# Patient Record
Sex: Female | Born: 1970 | Race: Black or African American | Hispanic: No | Marital: Single | Smoking: Current every day smoker
Health system: Southern US, Community
[De-identification: ages and names within clinical notes are randomized; demographics above are authoritative.]

## PROBLEM LIST (undated history)

## (undated) DIAGNOSIS — K5792 Diverticulitis of intestine, part unspecified, without perforation or abscess without bleeding: Secondary | ICD-10-CM

## (undated) DIAGNOSIS — K5790 Diverticulosis of intestine, part unspecified, without perforation or abscess without bleeding: Secondary | ICD-10-CM

---

## 2007-05-15 ENCOUNTER — Emergency Department: Payer: Self-pay | Admitting: Emergency Medicine

## 2007-05-15 IMAGING — CR RIGHT RING FINGER 2+V
1 series · 3 of 3 positions shown · non-contrast
Comparison: none

REASON FOR EXAM: injury minor care 1
COMMENTS:   LMP: Three weeks ago

PROCEDURE:     DXR - DXR FINGER RING 4TH DIGIT RT PALOH  - [DATE] [DATE]
RESULT:     Images of the RIGHT fourth finger demonstrate a nondisplaced,
non angulated fracture involving the distal phalanx in the midportion. The
remaining bony portions of the fourth digit appear to be intact.

[Series 1: view not recorded · 0.17mm/px · 3 of 3 slices shown]
[im 1/3]
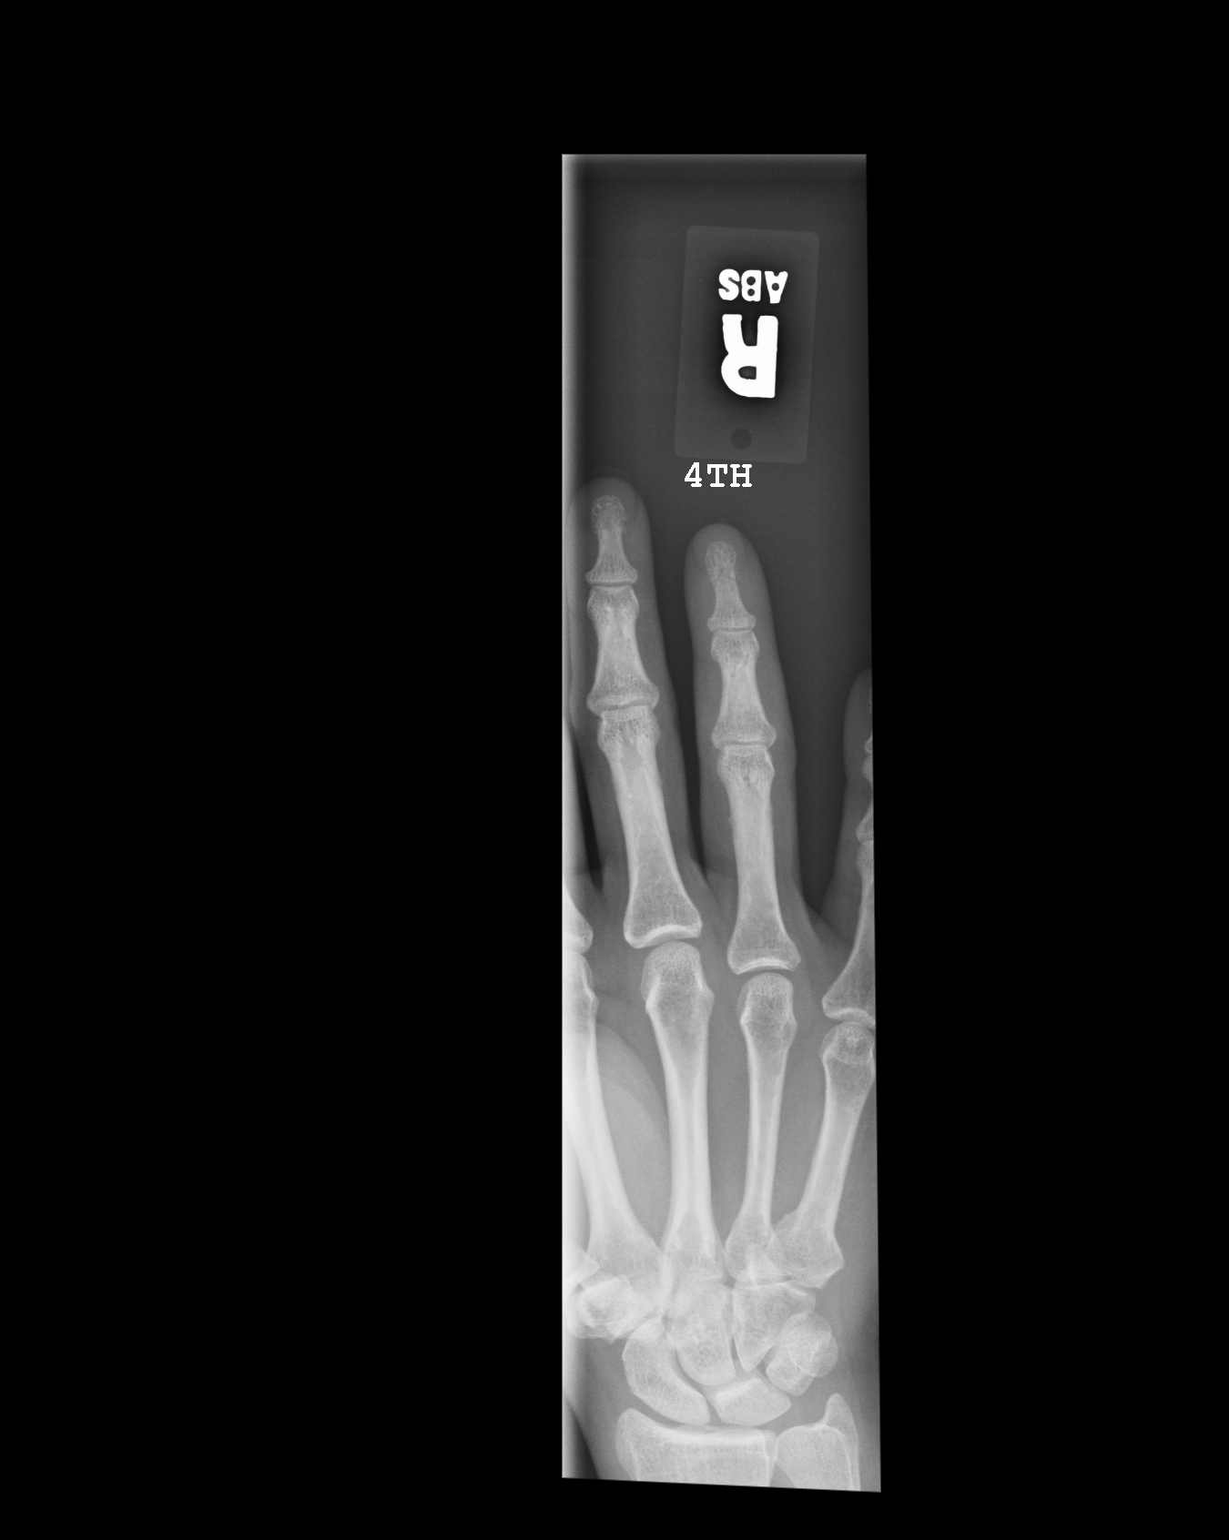
[im 2/3]
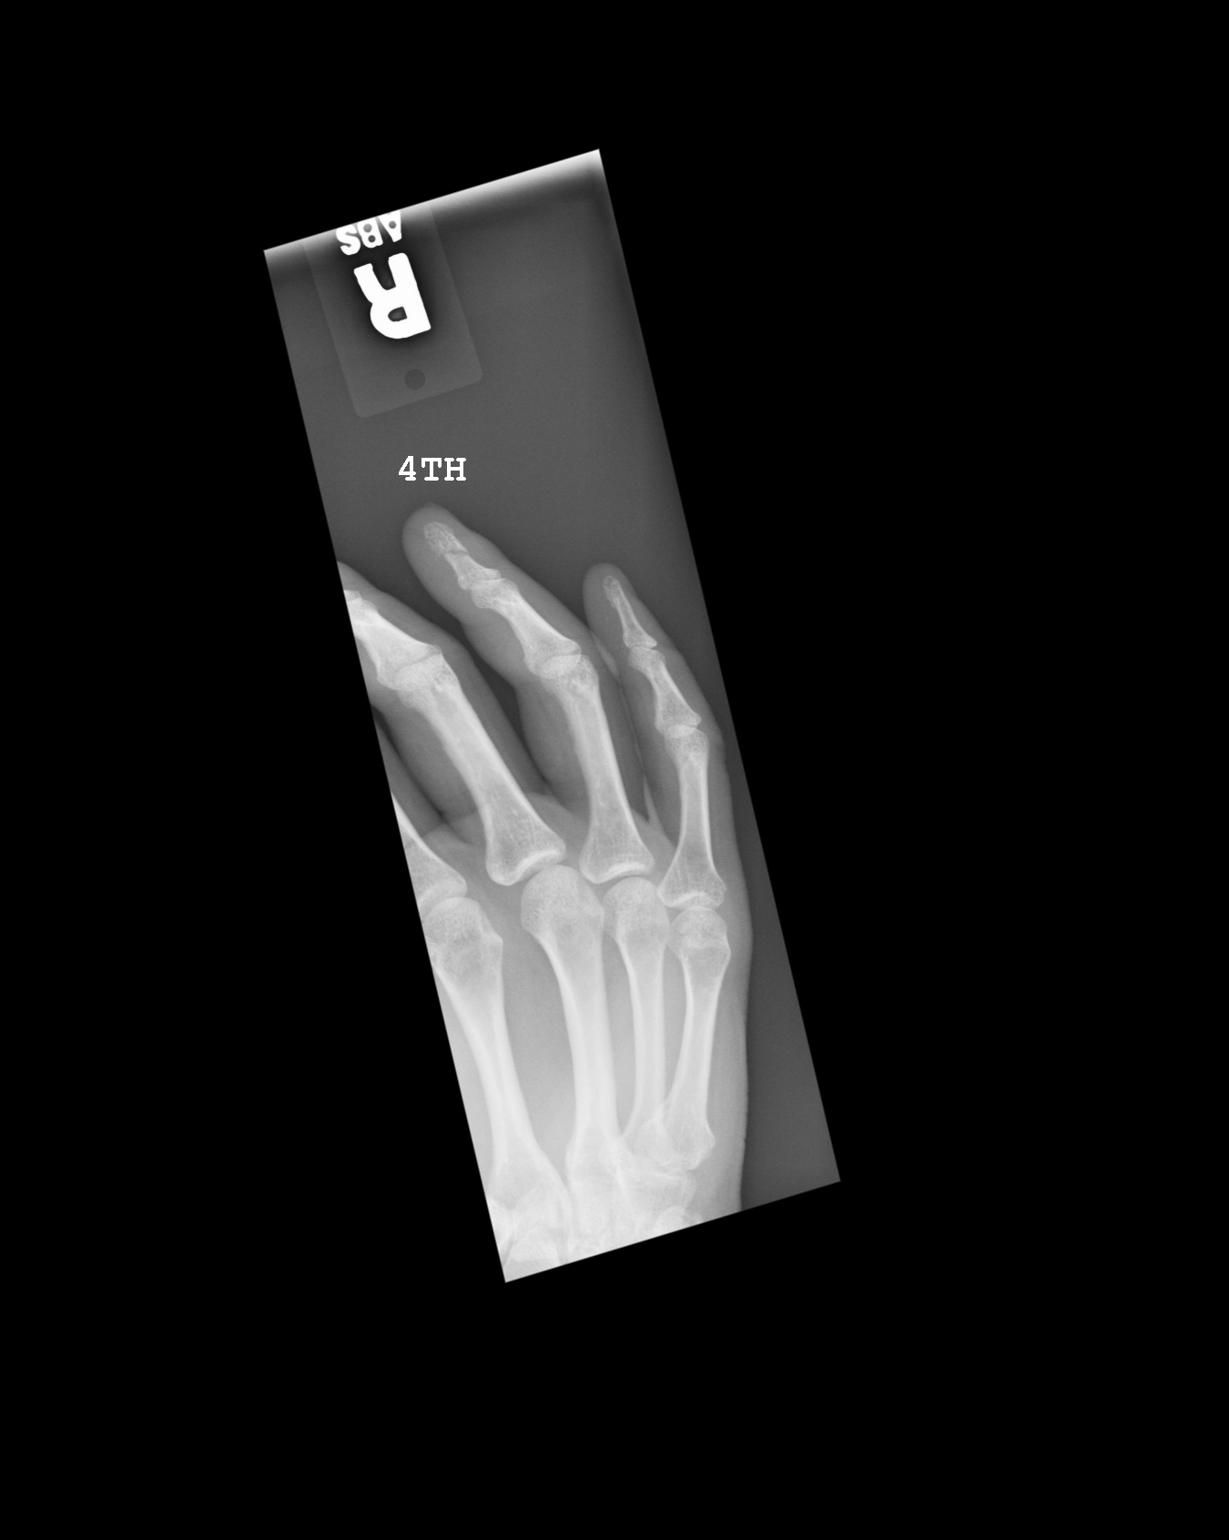
[im 3/3]
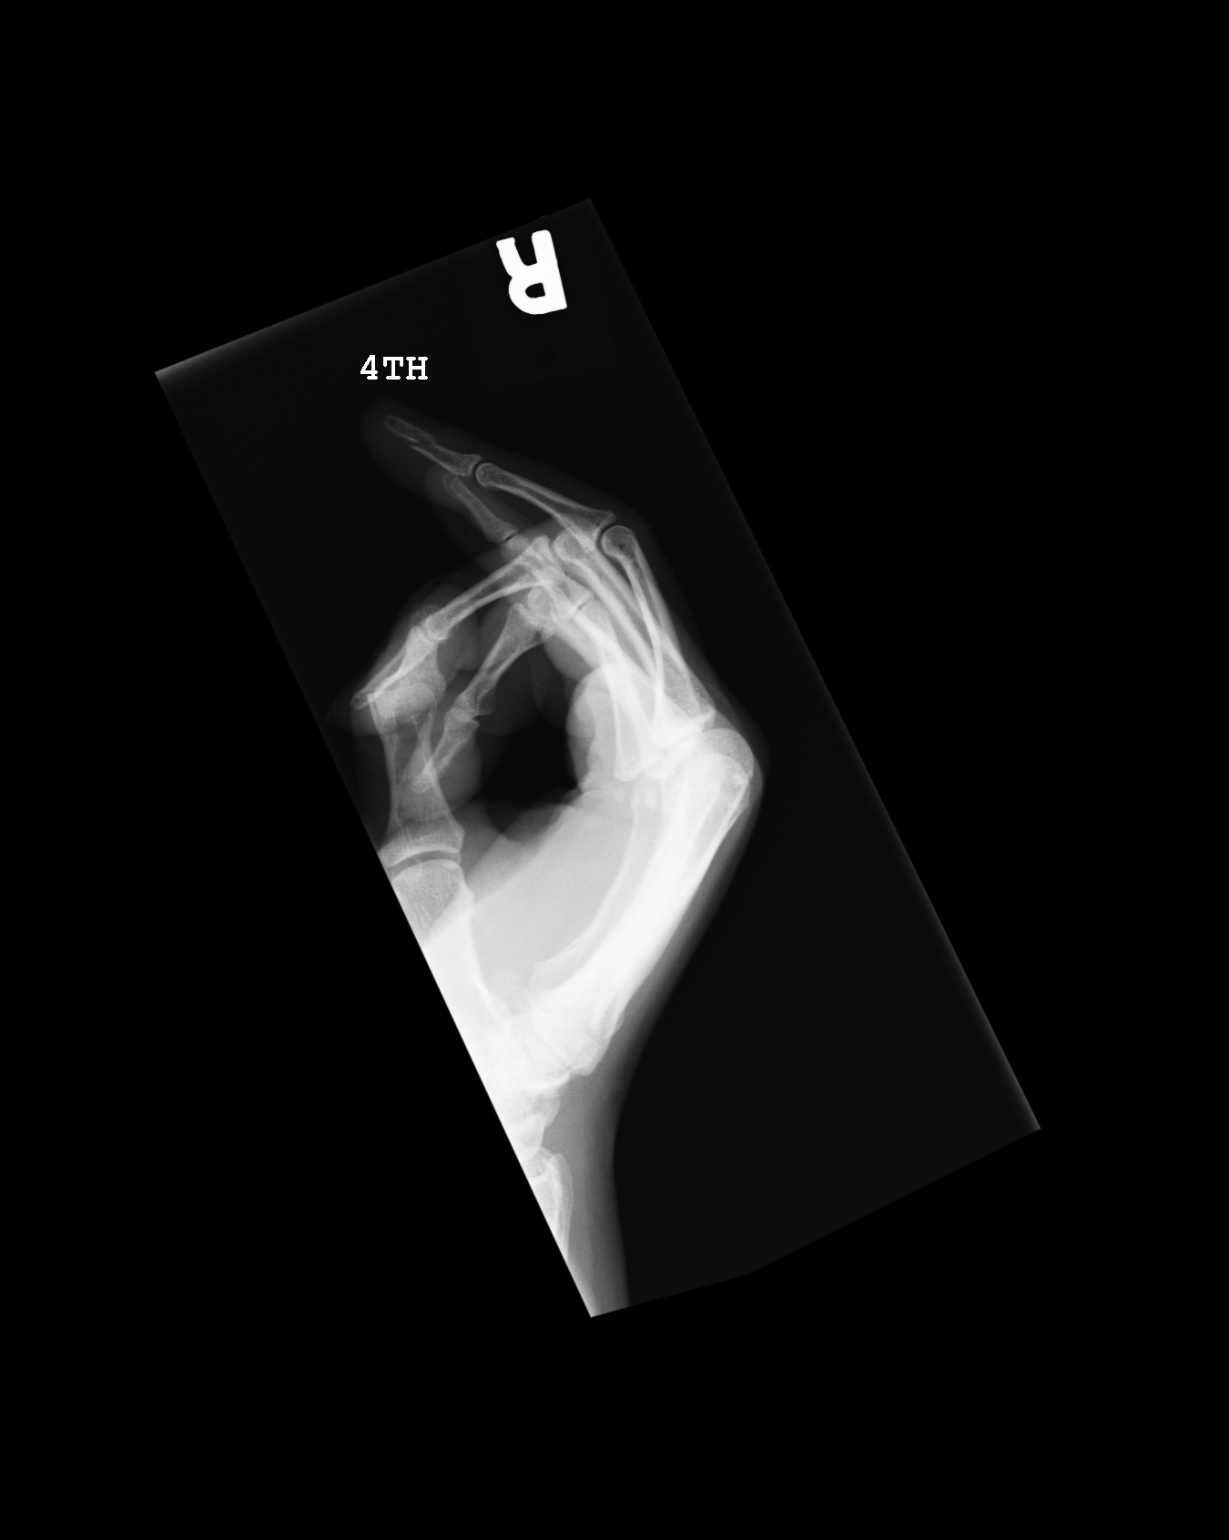

[3 of 3 positions shown; findings below may reference images not displayed]

IMPRESSION: Fracture in the distal phalanx of the fourth finger. This is somewhat
oblique in a transverse plane.

## 2008-08-29 ENCOUNTER — Emergency Department: Payer: Self-pay | Admitting: Emergency Medicine

## 2008-08-30 ENCOUNTER — Emergency Department: Payer: Self-pay | Admitting: Emergency Medicine

## 2009-11-18 ENCOUNTER — Emergency Department: Payer: Self-pay | Admitting: Emergency Medicine

## 2010-08-22 ENCOUNTER — Emergency Department: Payer: Self-pay | Admitting: Emergency Medicine

## 2011-05-01 ENCOUNTER — Emergency Department: Payer: Self-pay | Admitting: Emergency Medicine

## 2011-06-04 ENCOUNTER — Emergency Department: Payer: Self-pay | Admitting: Emergency Medicine

## 2012-02-11 ENCOUNTER — Emergency Department: Payer: Self-pay | Admitting: Unknown Physician Specialty

## 2012-02-11 LAB — URINALYSIS, COMPLETE
Bilirubin,UR: NEGATIVE
Glucose,UR: NEGATIVE mg/dL (ref 0–75)
Ph: 9 (ref 4.5–8.0)
Protein: 30
RBC,UR: 14 /HPF (ref 0–5)
WBC UR: 52 /HPF (ref 0–5)

## 2012-02-11 LAB — CBC WITH DIFFERENTIAL/PLATELET
Basophil %: 0.6 %
Eosinophil #: 0.1 10*3/uL (ref 0.0–0.7)
Eosinophil %: 0.5 %
HGB: 13.4 g/dL (ref 12.0–16.0)
Lymphocyte %: 10.3 %
MCHC: 32.5 g/dL (ref 32.0–36.0)
Neutrophil %: 84.4 %
RBC: 4.17 10*6/uL (ref 3.80–5.20)
RDW: 12.8 % (ref 11.5–14.5)

## 2012-02-11 LAB — BASIC METABOLIC PANEL
Calcium, Total: 9 mg/dL (ref 8.5–10.1)
Co2: 25 mmol/L (ref 21–32)
Creatinine: 0.72 mg/dL (ref 0.60–1.30)
EGFR (African American): >60
EGFR (Non-African Amer.): >60
Osmolality: 275 (ref 275–301)
Potassium: 4 mmol/L (ref 3.5–5.1)
Sodium: 138 mmol/L (ref 136–145)

## 2012-02-11 LAB — MAGNESIUM: Magnesium: 1.8 mg/dL

## 2012-02-11 LAB — WET PREP, GENITAL

## 2012-02-11 LAB — LIPASE, BLOOD: Lipase: 180 U/L (ref 73–393)

## 2012-02-13 LAB — URINE CULTURE

## 2012-08-21 ENCOUNTER — Inpatient Hospital Stay: Payer: Self-pay | Admitting: Obstetrics and Gynecology

## 2012-08-21 LAB — WET PREP, GENITAL

## 2012-08-21 LAB — URINALYSIS, COMPLETE
Leukocyte Esterase: NEGATIVE
Protein: 30
Specific Gravity: 1.025 (ref 1.003–1.030)
Squamous Epithelial: 6
WBC UR: 1 /HPF (ref 0–5)

## 2012-08-21 LAB — COMPREHENSIVE METABOLIC PANEL
Anion Gap: 5 — ABNORMAL LOW (ref 7–16)
BUN: 5 mg/dL — ABNORMAL LOW (ref 7–18)
Bilirubin,Total: 1.2 mg/dL — ABNORMAL HIGH (ref 0.2–1.0)
Co2: 28 mmol/L (ref 21–32)
Creatinine: 0.84 mg/dL (ref 0.60–1.30)
EGFR (African American): >60
Glucose: 99 mg/dL (ref 65–99)
Osmolality: 267 (ref 275–301)
Potassium: 3.4 mmol/L — ABNORMAL LOW (ref 3.5–5.1)
SGOT(AST): 68 U/L — ABNORMAL HIGH (ref 15–37)
SGPT (ALT): 112 U/L — ABNORMAL HIGH (ref 12–78)
Sodium: 135 mmol/L — ABNORMAL LOW (ref 136–145)

## 2012-08-21 LAB — CBC
HGB: 14.8 g/dL (ref 12.0–16.0)
MCH: 33.5 pg (ref 26.0–34.0)
MCV: 100 fL (ref 80–100)
Platelet: 259 10*3/uL (ref 150–440)
RBC: 4.41 10*6/uL (ref 3.80–5.20)
RDW: 12.3 % (ref 11.5–14.5)
WBC: 25.9 10*3/uL — ABNORMAL HIGH (ref 3.6–11.0)

## 2012-08-22 LAB — CBC WITH DIFFERENTIAL/PLATELET
HCT: 40.5 % (ref 35.0–47.0)
MCH: 33.2 pg (ref 26.0–34.0)
Platelet: 233 10*3/uL (ref 150–440)
RBC: 4.02 10*6/uL (ref 3.80–5.20)
Variant Lymphocyte - H1-Rlymph: 12 %
WBC: 25.1 10*3/uL — ABNORMAL HIGH (ref 3.6–11.0)

## 2012-08-22 LAB — BASIC METABOLIC PANEL
Anion Gap: 8 (ref 7–16)
BUN: 4 mg/dL — ABNORMAL LOW (ref 7–18)
Calcium, Total: 8.1 mg/dL — ABNORMAL LOW (ref 8.5–10.1)
Co2: 27 mmol/L (ref 21–32)
Creatinine: 0.84 mg/dL (ref 0.60–1.30)
EGFR (Non-African Amer.): >60
Glucose: 88 mg/dL (ref 65–99)
Sodium: 138 mmol/L (ref 136–145)

## 2012-08-23 LAB — CBC WITH DIFFERENTIAL/PLATELET
Basophil #: 0.1 10*3/uL (ref 0.0–0.1)
Basophil %: 0.3 %
Eosinophil %: 0.8 %
HGB: 12.1 g/dL (ref 12.0–16.0)
Lymphocyte #: 0.8 10*3/uL — ABNORMAL LOW (ref 1.0–3.6)
MCH: 33.4 pg (ref 26.0–34.0)
MCHC: 33.7 g/dL (ref 32.0–36.0)
MCV: 99 fL (ref 80–100)
Monocyte #: 2.2 x10 3/mm — ABNORMAL HIGH (ref 0.2–0.9)
Monocyte %: 10.7 %
Neutrophil #: 17.6 10*3/uL — ABNORMAL HIGH (ref 1.4–6.5)
Platelet: 229 10*3/uL (ref 150–440)
RDW: 12.2 % (ref 11.5–14.5)

## 2012-08-24 LAB — URINALYSIS, COMPLETE
Bilirubin,UR: NEGATIVE
Glucose,UR: NEGATIVE mg/dL (ref 0–75)
Ketone: NEGATIVE
Leukocyte Esterase: NEGATIVE
Nitrite: NEGATIVE
Ph: 7 (ref 4.5–8.0)
Protein: NEGATIVE
RBC,UR: 2 /HPF (ref 0–5)
Specific Gravity: 1.006 (ref 1.003–1.030)

## 2012-08-24 LAB — COMPREHENSIVE METABOLIC PANEL
Anion Gap: 7 (ref 7–16)
BUN: 4 mg/dL — ABNORMAL LOW (ref 7–18)
Chloride: 107 mmol/L (ref 98–107)
Co2: 26 mmol/L (ref 21–32)
Creatinine: 0.94 mg/dL (ref 0.60–1.30)
EGFR (Non-African Amer.): >60
Glucose: 102 mg/dL — ABNORMAL HIGH (ref 65–99)
Potassium: 3.7 mmol/L (ref 3.5–5.1)
SGOT(AST): 24 U/L (ref 15–37)
SGPT (ALT): 44 U/L (ref 12–78)
Sodium: 140 mmol/L (ref 136–145)

## 2012-08-24 LAB — RAPID HIV-1/2 QL/CONFIRM: HIV-1/2,Rapid Ql: NEGATIVE

## 2012-08-24 LAB — CBC WITH DIFFERENTIAL/PLATELET
Basophil #: 0.1 10*3/uL (ref 0.0–0.1)
Eosinophil #: 0.2 10*3/uL (ref 0.0–0.7)
Eosinophil %: 1.3 %
HGB: 11.5 g/dL — ABNORMAL LOW (ref 12.0–16.0)
Lymphocyte #: 1.4 10*3/uL (ref 1.0–3.6)
MCV: 100 fL (ref 80–100)
Monocyte #: 1.9 x10 3/mm — ABNORMAL HIGH (ref 0.2–0.9)
Monocyte %: 10.5 %
Monocytes: 5 %
Neutrophil %: 79.9 %
Platelet: 225 10*3/uL (ref 150–440)
RBC: 3.6 10*6/uL — ABNORMAL LOW (ref 3.80–5.20)
RDW: 12.4 % (ref 11.5–14.5)
WBC: 17.6 10*3/uL — ABNORMAL HIGH (ref 3.6–11.0)

## 2012-08-25 LAB — CBC WITH DIFFERENTIAL/PLATELET
Basophil #: 0 10*3/uL (ref 0.0–0.1)
Basophil %: 0.2 %
Eosinophil %: 1.8 %
HCT: 35.2 % (ref 35.0–47.0)
HGB: 11.6 g/dL — ABNORMAL LOW (ref 12.0–16.0)
Lymphocyte %: 10.5 %
MCHC: 33 g/dL (ref 32.0–36.0)
MCV: 100 fL (ref 80–100)
Monocyte #: 2.3 x10 3/mm — ABNORMAL HIGH (ref 0.2–0.9)
Monocyte %: 12.5 %
Neutrophil #: 13.9 10*3/uL — ABNORMAL HIGH (ref 1.4–6.5)
RBC: 3.54 10*6/uL — ABNORMAL LOW (ref 3.80–5.20)
RDW: 12.5 % (ref 11.5–14.5)
WBC: 18.6 10*3/uL — ABNORMAL HIGH (ref 3.6–11.0)

## 2012-08-25 LAB — COMPREHENSIVE METABOLIC PANEL
Albumin: 2.3 g/dL — ABNORMAL LOW (ref 3.4–5.0)
Anion Gap: 9 (ref 7–16)
BUN: 5 mg/dL — ABNORMAL LOW (ref 7–18)
Bilirubin,Total: 1 mg/dL (ref 0.2–1.0)
Calcium, Total: 8.5 mg/dL (ref 8.5–10.1)
Creatinine: 0.88 mg/dL (ref 0.60–1.30)
EGFR (African American): >60
EGFR (Non-African Amer.): >60
Potassium: 4.2 mmol/L (ref 3.5–5.1)
Sodium: 140 mmol/L (ref 136–145)
Total Protein: 6.9 g/dL (ref 6.4–8.2)

## 2012-08-27 LAB — CULTURE, BLOOD (SINGLE)

## 2014-04-07 ENCOUNTER — Emergency Department: Payer: Self-pay | Admitting: Emergency Medicine

## 2014-04-07 LAB — URINALYSIS, COMPLETE
BILIRUBIN, UR: NEGATIVE
Bacteria: NONE SEEN
GLUCOSE, UR: NEGATIVE mg/dL (ref 0–75)
KETONE: NEGATIVE
LEUKOCYTE ESTERASE: NEGATIVE
Nitrite: NEGATIVE
PROTEIN: NEGATIVE
Ph: 7 (ref 4.5–8.0)
Specific Gravity: 1.012 (ref 1.003–1.030)
Squamous Epithelial: 14
WBC UR: 2 /HPF (ref 0–5)

## 2014-04-07 LAB — COMPREHENSIVE METABOLIC PANEL
ALBUMIN: 3.3 g/dL — AB (ref 3.4–5.0)
ALT: 53 U/L
AST: 64 U/L — AB (ref 15–37)
Alkaline Phosphatase: 95 U/L
BILIRUBIN TOTAL: 0.6 mg/dL (ref 0.2–1.0)
BUN: 6 mg/dL — ABNORMAL LOW (ref 7–18)
CO2: 25 mmol/L (ref 21–32)
CREATININE: 0.86 mg/dL (ref 0.60–1.30)
Calcium, Total: 8.6 mg/dL (ref 8.5–10.1)
Chloride: 109 mmol/L — ABNORMAL HIGH (ref 98–107)
EGFR (African American): >60
EGFR (Non-African Amer.): >60
Glucose: 82 mg/dL (ref 65–99)
Osmolality: 263 (ref 275–301)
POTASSIUM: 3.7 mmol/L (ref 3.5–5.1)
Sodium: 133 mmol/L — ABNORMAL LOW (ref 136–145)
TOTAL PROTEIN: 8 g/dL (ref 6.4–8.2)

## 2014-04-07 LAB — CBC WITH DIFFERENTIAL/PLATELET
BASOS PCT: 0.7 %
Basophil #: 0.1 10*3/uL (ref 0.0–0.1)
Eosinophil #: 0.9 10*3/uL — ABNORMAL HIGH (ref 0.0–0.7)
Eosinophil %: 5 %
HCT: 46.8 % (ref 35.0–47.0)
HGB: 15.2 g/dL (ref 12.0–16.0)
Lymphocyte #: 2.3 10*3/uL (ref 1.0–3.6)
Lymphocyte %: 13.2 %
MCH: 33.1 pg (ref 26.0–34.0)
MCHC: 32.4 g/dL (ref 32.0–36.0)
MCV: 102 fL — ABNORMAL HIGH (ref 80–100)
MONO ABS: 1.6 x10 3/mm — AB (ref 0.2–0.9)
Monocyte %: 8.9 %
Neutrophil #: 12.7 10*3/uL — ABNORMAL HIGH (ref 1.4–6.5)
Neutrophil %: 72.2 %
Platelet: 276 10*3/uL (ref 150–440)
RBC: 4.58 10*6/uL (ref 3.80–5.20)
RDW: 12.8 % (ref 11.5–14.5)
WBC: 17.6 10*3/uL — AB (ref 3.6–11.0)

## 2014-04-07 LAB — PREGNANCY, URINE: PREGNANCY TEST, URINE: NEGATIVE m[IU]/mL

## 2014-04-07 LAB — GC/CHLAMYDIA PROBE AMP

## 2014-04-07 LAB — TROPONIN I: Troponin-I: <0.02 ng/mL

## 2014-04-07 LAB — WET PREP, GENITAL

## 2014-04-07 LAB — LIPASE, BLOOD: LIPASE: 147 U/L (ref 73–393)

## 2014-11-15 NOTE — Consult Note (Signed)
Brief Consult Note: Diagnosis: PID.   Patient was seen by consultant.   Consult note dictated.   Discussed with Attending MD.   Comments: No RLQ abdominal pain or tenderness, exquisite bilateral pelvic tenderness. CT reviewed. No evidence of appendicitis. This is recurrent PID with multiple pevic cysts.  Electronic Signatures: Claude MangesMarterre, Toby Breithaupt F (MD)  (Signed 27-Jan-14 21:00)  Authored: Brief Consult Note   Last Updated: 27-Jan-14 21:00 by Claude MangesMarterre, Isabel Freese F (MD)

## 2014-11-15 NOTE — Consult Note (Signed)
PATIENT NAME:  Glenda Sloan, Glenda Sloan MR#:  [id number] DATE OF BIRTH:  [birth year]  DATE OF CONSULTATION:  08/21/2012  REFERRING PHYSICIAN:   CONSULTING PHYSICIAN:  Claude MangesWilliam F. Manika Hast, MD  CONSULTATION NOTE (ED VISIT)  HISTORY OF PRESENT ILLNESS: Glenda Sloan is a 44 year old black female who has had multiple episodes of pelvic pain in the last year and in fact had an episode of this four years ago where she was seen in the Emergency Department. She says she always gets Cipro and Flagyl and it goes away. This episode is no different from the previous episodes in that it began and is still located in her pelvis and suprapubic region. She had no periumbilical pain and has had no fever. Specifically, in February 2010, she was noted to have hydrosalpinx on ultrasound examination, after an 11 mm appendix was seen, but there was no periappendiceal stranding on that CT scan and it was read as pelvic inflammatory disease. She has had no urinary symptoms with this episode either.   PAST MEDICAL HISTORY: Multiple episodes of pelvic inflammatory disease.   MEDICATIONS: Advil 200 mg q. 4 hours p.r.n.   ALLERGIES: No known drug allergies.   REVIEW OF SYSTEMS: Negative for 10 systems, except as mentioned in the history of present illness.   FAMILY HISTORY: Not taken.  SOCIAL HISTORY: Not taken.  PHYSICAL EXAMINATION: GENERAL: A pleasant, middle-aged black female lying comfortably on the emergency department stretcher. Height 5 feet 4 inches, weight 175 pounds and BMI 30.1.  VITALS: Temperature 98.7, pulse 89, respirations 20, blood pressure 131/79 and oxygen saturation 99% on room air at rest.  HEENT: Pupils equally round and reactive to light. Extraocular movements intact. Sclerae anicteric. Oropharynx clear. Mucous membranes moist. Hearing intact to voice.  NECK: Supple with no tracheal deviation or jugular venous distention.  HEART: Regular rate and rhythm with no murmurs or rubs.  LUNGS: Clear to auscultation  with normal respiratory effort bilaterally.  ABDOMEN: Soft, flat and nontender with the exception of the suprapubic region and the tenderness is just as pronounced on the left as it is the right. Specifically, the patient has no rebound tenderness or guarding in the right lower quadrant or near McBurney's point.  EXTREMITIES: No edema with normal capillary refill bilaterally.  NEUROLOGIC: Cranial nerves II through XII, motor and sensation grossly intact.  PSYCHIATRIC: Alert and oriented x 4 with appropriate affect.   ADDITIONAL STUDIES: White blood cell count 26,000, hemoglobin 15 and hematocrit 44.  Urinalysis is essentially normal. Chemistries are essentially normal.   CT scan shows an 11.9 mm curved appendix with no periappendiceal stranding, but multiple cysts within the pelvis and significant pelvic inflammation surrounding them. I reviewed the CT scan myself and agree with the reading that this is most likely pelvic inflammatory disease.   ASSESSMENT: Pelvic inflammatory disease with no evidence of acute appendicitis on either the history, physical examination, or CT scan examination (with the exception of the diameter of the appendix which was noted to be similar size 4 years ago on a previous CT). ____________________________ Claude MangesWilliam F. Shaneya Taketa, MD wfm:sb D: 08/21/2012 21:06:12 ET T: 08/22/2012 07:39:11 ET JOB#: [id number]  cc: Claude MangesWilliam F. Vertie Dibbern, MD, <Dictator> Claude MangesWILLIAM F Hindy Perrault MD ELECTRONICALLY SIGNED 08/22/2012 22:16

## 2014-11-15 NOTE — Consult Note (Signed)
PATIENT NAME:  Glenda Sloan, Glenda Sloan MR#:  [id number] DATE OF BIRTH:  10-15-1970  DATE OF CONSULTATION:  08/23/2012  REFERRING PHYSICIAN:  Dr. Erik Obey  CONSULTING PHYSICIAN: Keith Rake, MD/Vadie Principato A. Jerelene Redden, ANP  REASON FOR CONSULTATION: Abdominal pain, possible diverticulitis.   HISTORY OF PRESENT ILLNESS: This 44 year old patient has a history of multiple episodes of pelvic pain over the last few years. The patient says this is about her fourth episode. She says last week she developed a sense of constipation and Friday was able to have 2 or 3 small bowel movements. There was some lower abdominal discomfort and this escalated over the weekend. This is a sharp pain that is more prominent on the right lower abdomen, pelvic area than on the left. She has not had a bowel movement since Friday. She has noted no blood or melena. There is no nausea or vomiting, but there has been positive fever, chills. She spiked a temperature of 102 last night. The patient is receiving Tylenol with Codeine and ibuprofen and has antibiotic therapy. She reports her abdominal pain has improved. Last menstrual period was about 3 weeks ago, normal for her 5 days with mild cramps. Urine pregnancy was negative. The patient reports a stable sexual partner for 3 years and denies any history of sexually transmitted disease with this boyfriend. She does not utilize birth control.  Abdominal and pelvis CT scan on admission with IV contrast only showed significant inflammatory changes in the pelvis with surgical and GYN physicians evaluating suggesting PID. The impression was that this was not an acute appendicitis, but there was a distended appearance of the appendix with proximity to the anterior right lateral aspect of the inflammatory change. There did not appear to be significant adjacent inflammatory stranding at the appendix. No prior history of colonoscopy. The patient had several CT studies and pelvic ultrasounds in the past with  known ovarian cyst.   PAST MEDICAL HISTORY: 1.  Multiple episodes of pelvic inflammatory disease.  2. Sexually transmitted disease as a teenager, times at least 4, with the patient reporting gonorrhea/Chlamydia/ trichomonas.   PAST SURGICAL HISTORY: Denied.  MEDICATIONS ON ARRIVAL: Ibuprofen 200 mg 2 tablets daily p.r.n. menstrual cramps or Advil 200 mg a couple times daily.      ALLERGIES: No known drug allergies.   HABITS: Positive tobacco, 1 pack per week since 1995, occasional social alcohol use.   FAMILY HISTORY: Mother is alive 86, healthy, father deceased with alcoholism, negative for GI malignancy.   SOCIAL HISTORY: The patient is not married, has an adult 83 year old son who is healthy, has a boyfriend of 3 years. 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   REVIEW OF SYSTEMS: Ten systems reviewed and negative with the exception as noted in history of present illness.  Positive palpable breast mass.   PHYSICAL EXAMINATION:  VITAL SIGNS:  Temperature 98.3, on recheck 100.8, heart rate, BP 122/83 and 99% room air oxygen pulse oximetry.  GENERAL: Well-appearing, slightly overweight African American female resting in bed, in no acute distress.  HEENT: Head is normocephalic. Conjunctivae pink. Sclerae is anicteric. Oral mucosa is moist and intact.  NECK: Supple. Trachea is midline.  HEART: Heart tones S1, S2 without murmur or gallop.  LUNGS: Clear to auscultation. Respirations eupneic.  ABDOMEN: Soft, positive bowel sounds, positive tenderness very low bilateral groin region, with positive rebound tenderness. No guarding. No masses palpable.  RECTAL: Digital rectal exam with is a small amount of formed stool in the vault,  brown, heme negative. Smooth walls, no tenderness.  EXTREMITIES: Lower extremities without edema, cyanosis, clubbing.  SKIN: Skin is moist, defervescing fever, no obvious rash noted.  MUSCULOSKELETAL: Without joint swelling, inflammation and movement of  extremities x 4.  NEUROLOGIC: Cranial nerves II through XII grossly intact.  PSYCHIATRIC: Affect and mood within normal. The patient appears calm and relaxed.   LABORATORY STUDIES: Admission blood work with glucose 99, BUN 5, creatinine 0.84, sodium 135, potassium 3.4, albumin 3.1, total bilirubin 1.2, AST 68, ALT 112. WBC 25.9, hemoglobin 14.8. Blood culture negative, wet prep negative, urine hazy amber, with positive protein, positive RBC 24 per high-power field, positive mucus present.   Repeat laboratory studies 08/22/2012 with BUN 4, creatinine 0.84, potassium 3.3, WBC 25.1 down to 20.8, hemoglobin 12.1, platelet count 229.   RADIOLOGY:  CT scan of the abdomen and pelvis as noted with significant inflammatory changes in the pelvis. Breast ultrasound with indeterminate, ovoid lesion in the 10 o'clock position, right breast. Mammogram returns with indeterminate nodule on ultrasound, surgical consult recommended.   IMPRESSION: The patient presents with  6-day history of pelvic pain, fevers, chills, slight sense of vomiting, leukocytosis, and abnormal CT scan showing significant inflammatory changes in the pelvis. This does not present as a diverticulitis. There is no evidence of diverticulitis or diverticulosis on the CT study. This is the patient's third or fourth event of this type of pain, per her description. She has had history of ovarian cysts and there is possibility of pelvic inflammatory disease versus infected cyst. The patient seems to be responding to IV antibiotic therapy and she does have coverage to include antibiotics for diverticulitis.   PLAN: 1  Repeat CT scan with both IV and oral contrast tomorrow per Dr. Vira Agar recommendation.  2.  Repeat laboratory studies to include a CBC, manual diff, will add MET-C to check liver labs. She presents with elevation in liver labs without history of known liver disease.  3.  No indication for repeat pelvic ultrasound per the gynecology group  following her.  4.  The patient does report constipation, digital rectal exam showed small amount of formed stool brown, heme negative. No prior history of colonoscopy. The patient says she typically does not have constipation or bowel changes except in the setting of this last acute abdominal pain event.  5.  Further GI recommendations pending findings.   These services provided by Joelene Millin A. Jerelene Redden, MS, APRN, BC, ANP under collaborative agreement with Gaylyn Cheers, M.D.   ____________________________ Janalyn Harder. Jerelene Redden, ANP kam:cc D: 08/23/2012 17:46:43 ET T: 08/23/2012 19:50:12 ET JOB#: [id number]  cc: Joelene Millin A. Jerelene Redden, ANP, <Dictator> Janalyn Harder. Sherlyn Hay, MSN, ANP-BC Adult Nurse Practitioner ELECTRONICALLY SIGNED 08/24/2012 13:38

## 2014-11-15 NOTE — Consult Note (Signed)
CC: fever and abd pain.  The CT does not show any abnormality in GI area.  No diverticulitis, no diverticulosis mentioned.  Hx of PID and now findings suggestive of GYN origin.  I will sign off, reconsult if needed.  Electronic Signatures: Scot JunElliott, Robert T (MD)  (Signed on 30-Jan-14 12:44)  Authored  Last Updated: 30-Jan-14 12:44 by Scot JunElliott, Robert T (MD)

## 2014-11-15 NOTE — H&P (Signed)
    Subjective/Chief Complaint pelvic pain    History of Present Illness 44 yo AA female with LMP of 07/22/12 who presntst ot he Er WITH pelvic pain. she has a hisotry of diverticulitis and she has a green diarrhea episode on friday and she has not had any BM since. the pain has increased and she lost her appetite around fRI night. sh epresnets to the ER today with fever and abdominal bloating. CT scan shows infalmmatory changes can not rule out appy- but not likely. HEr WBC was 25K and her K was low at 3.2. she douches and the last time she douched was one week ago.  she smokes but does nto crave it. she has had one vaginal delivery of a 6-5 baby bot, she has never had an abnl pap smear and she has never had a mmaogram.    Past History she has had three episodes of "PID" over the last few years. she has divertilculitis and she actually thought this was a bout of the diverticulitis given her BM issues the last four days.    Past Medical Health Smoking    Primary Physician er   Past Med/Surgical Hx:  Diverticulitis:   ALLERGIES:  No Known Allergies:   Family and Social History:   Family History Non-Contributory  Smoking    Social History positive  tobacco, positive  tobacco (Current within 1 year)    + Tobacco Current (within 1 year)    Place of Living trailer with BF   Review of Systems:   Subjective/Chief Complaint belly hurts and she has no appetite and is nauseated    Fever/Chills Yes    Cough No    Sputum No    Abdominal Pain Yes    Diarrhea Yes    Constipation Yes    Nausea/Vomiting Yes    SOB/DOE No    Chest Pain No    Dysuria No    Tolerating Diet Nauseated    Medications/Allergies Reviewed Medications/Allergies reviewed   Physical Exam:   GEN thin, disheveled    HEENT PERRL, hearing intact to voice, poor dentition    NECK supple  No masses  thyroid not tender    RESP normal resp effort  clear BS    CARD regular rate    ABD positive tenderness   denies Flank Tenderness  no liver/spleen enlargement  no hernia  distended  hypoactive BS    GU no superpubic tenderness    LYMPH negative neck, negative axillae    EXTR negative edema    SKIN normal to palpation    NEURO cranial nerves intact    PSYCH alert, A+O to time, place, person    Additional Comments breast exam mass at 9 oclock 3 cm at the right breast mobile cystic in feeling. left with lumps and bumps no singel mass that shnds out     Assessment/Admission Diagnosis pid versus ruptured diverticulum,    Plan antibiotics, evaluate breast with mmg and us. slow appetite advanceing, pain meds and bowel stimulation/   Electronic Signatures: Adria DevonKlett, Alyda Megna (MD)  (Signed 27-Jan-14 23:30)  Authored: CHIEF COMPLAINT and HISTORY, PAST MEDICAL/SURGIAL HISTORY, ALLERGIES, FAMILY AND SOCIAL HISTORY, REVIEW OF SYSTEMS, PHYSICAL EXAM, ASSESSMENT AND PLAN   Last Updated: 27-Jan-14 23:30 by Adria DevonKlett, Luca Dyar (MD)

## 2014-11-15 NOTE — Consult Note (Signed)
PATIENT NAME:  Glenda Sloan, Glenda Sloan MR#:  [id number] DATE OF BIRTH:  [birth year]  DATE OF CONSULTATION:  08/24/2012  REFERRING PHYSICIAN:   CONSULTING PHYSICIAN:  Glenda Deerhristopher A. Teron Blais, MD  HISTORY OF PRESENT ILLNESS: Glenda Sloan is a pleasant 44 year old female who presented to the ED on 01/24 with pelvic pain. She was found to have multiple cystic structures in her abdomen as well as a leukocytosis which was concerning for pelvic inflammatory disease, but she does have a history of diverticulitis and so she is being worked  for that. I was called for a right breast mass that was noticed on physical exam. She says that she has never noticed it before, has never noticed any lumps or bumps, does not have any pain associated with it. She was noted to have occasional drainage from the right breast, which she says is a milky solution, every few months. Otherwise, she does not have any family members with history of breast cancer. She has had one child, at age 44. Menses began at age 44. She has been on oral contraceptives for short-term. Normal mammograms and does not have any family with gynecologic history.   PAST MEDICAL HISTORY: History of multiple episodes of PID over the last few years, history of diverticulitis.   DRUG ALLERGIES: No known drug allergies.   HOME MEDICATIONS: Advil 1 tab 200 mg p.o. q. 4 hours p.r.n. pain.   SOCIAL HISTORY: Is a current smoker. Denies alcohol or other drug use. Lives in a trailer with her boyfriend.   REVIEW OF SYSTEMS: A 12 point review of systems was obtained. Pertinent positives and negatives as above.   PHYSICAL EXAMINATION: VITAL SIGNS: Current temperature is 98, pulse 88, blood pressure 119/75, respirations 16 and 100% on room air.  GENERAL: No acute distress. Alert and oriented x 3. Pleasant.  HEAD: Normocephalic, atraumatic.  EYES: No scleral icterus. No conjunctivitis.  FACE: No obvious facial trauma. Normal external ears. Normal external nose.  NECK: No  obvious neck masses palpated.  CHEST: Lungs clear to auscultation, moving air well.  HEART: Regular rate and rhythm. No murmurs, rubs or gallops.  LYMPH: Supraclavicular: No palpable lymphadenopathy. Right and left axilla: No palpable lymphadenopathy. BREASTS: Prominent mass at superior areola, right breast, which is nontender to palpation, approximately 3 x 1 cm. No obvious discharge. No other palpable masses.  ABDOMEN: Soft, mildly tender in the suprapubic region. Nondistended.  EXTREMITIES: Strength 5 out of 5 in all four extremities.  NEUROLOGIC: Cranial nerves II through XII grossly intact. Sensation intact in all 4 extremities.   CURRENT LABS: White cell count is 17.6, which is down. Bilirubin is 1.6. Otherwise LFTs are normal. Chemistry is normal.   IMAGING: Mammogram shows an indeterminate area at approximately 10:00, on her right breast.  Ultrasound showed an ovoid, hypoechoic area with questionable cystic regions.   ASSESSMENT AND PLAN: Glenda Sloan is a pleasant 44 year old female with history of right breast mass who is currently undergoing evaluation for what sounds like pelvic inflammatory disease. Will evaluate the patient as an outpatient for possible needle localization versus excisional biopsy depending on the patient's level of anxiety.  ____________________________ Glenda Raiderhristopher A. Sherrod Toothman, MD cal:sb D: 08/24/2012 11:47:20 ET T: 08/24/2012 11:57:52 ET JOB#: [id number]  cc: Glenda Deerhristopher A. Kinzly Pierrelouis, MD, <Dictator> Glenda NewcomerHRISTOPHER A Jenniger Figiel MD ELECTRONICALLY SIGNED 08/26/2012 13:22

## 2015-06-29 ENCOUNTER — Encounter: Payer: Self-pay | Admitting: *Deleted

## 2015-06-29 ENCOUNTER — Emergency Department

## 2015-06-29 ENCOUNTER — Emergency Department
Admission: EM | Admit: 2015-06-29 | Discharge: 2015-06-29 | Attending: Emergency Medicine | Admitting: Emergency Medicine

## 2015-06-29 DIAGNOSIS — R1011 Right upper quadrant pain: Secondary | ICD-10-CM | POA: Diagnosis not present

## 2015-06-29 DIAGNOSIS — R1013 Epigastric pain: Secondary | ICD-10-CM | POA: Diagnosis not present

## 2015-06-29 DIAGNOSIS — Z87891 Personal history of nicotine dependence: Secondary | ICD-10-CM | POA: Insufficient documentation

## 2015-06-29 DIAGNOSIS — R61 Generalized hyperhidrosis: Secondary | ICD-10-CM | POA: Diagnosis not present

## 2015-06-29 DIAGNOSIS — Z3202 Encounter for pregnancy test, result negative: Secondary | ICD-10-CM | POA: Insufficient documentation

## 2015-06-29 DIAGNOSIS — R1110 Vomiting, unspecified: Secondary | ICD-10-CM | POA: Diagnosis not present

## 2015-06-29 DIAGNOSIS — R109 Unspecified abdominal pain: Secondary | ICD-10-CM | POA: Diagnosis present

## 2015-06-29 HISTORY — DX: Diverticulosis of intestine, part unspecified, without perforation or abscess without bleeding: K57.90

## 2015-06-29 LAB — URINALYSIS COMPLETE WITH MICROSCOPIC (ARMC ONLY)
BACTERIA UA: NONE SEEN
Bilirubin Urine: NEGATIVE
GLUCOSE, UA: NEGATIVE mg/dL
LEUKOCYTES UA: NEGATIVE
NITRITE: NEGATIVE
PROTEIN: NEGATIVE mg/dL
SPECIFIC GRAVITY, URINE: 1.019 (ref 1.005–1.030)
pH: 5 (ref 5.0–8.0)

## 2015-06-29 LAB — CBC
HEMATOCRIT: 46.2 % (ref 35.0–47.0)
Hemoglobin: 15.3 g/dL (ref 12.0–16.0)
MCH: 32.2 pg (ref 26.0–34.0)
MCHC: 33.1 g/dL (ref 32.0–36.0)
MCV: 97.4 fL (ref 80.0–100.0)
Platelets: 284 10*3/uL (ref 150–440)
RBC: 4.74 MIL/uL (ref 3.80–5.20)
RDW: 12.7 % (ref 11.5–14.5)
WBC: 14.9 10*3/uL — AB (ref 3.6–11.0)

## 2015-06-29 LAB — COMPREHENSIVE METABOLIC PANEL
ALT: 23 U/L (ref 14–54)
ANION GAP: 8 (ref 5–15)
AST: 21 U/L (ref 15–41)
Albumin: 4.3 g/dL (ref 3.5–5.0)
Alkaline Phosphatase: 77 U/L (ref 38–126)
BILIRUBIN TOTAL: 0.5 mg/dL (ref 0.3–1.2)
BUN: 8 mg/dL (ref 6–20)
CO2: 29 mmol/L (ref 22–32)
Calcium: 9.7 mg/dL (ref 8.9–10.3)
Chloride: 101 mmol/L (ref 101–111)
Creatinine, Ser: 0.84 mg/dL (ref 0.44–1.00)
Glucose, Bld: 99 mg/dL (ref 65–99)
POTASSIUM: 3.7 mmol/L (ref 3.5–5.1)
Sodium: 138 mmol/L (ref 135–145)
TOTAL PROTEIN: 8.7 g/dL — AB (ref 6.5–8.1)

## 2015-06-29 LAB — LIPASE, BLOOD: Lipase: 25 U/L (ref 11–51)

## 2015-06-29 LAB — PREGNANCY, URINE: PREG TEST UR: NEGATIVE

## 2015-06-29 MED ORDER — ONDANSETRON HCL 4 MG/2ML IJ SOLN
4.0000 mg | Freq: Once | INTRAMUSCULAR | Status: AC
  Administered 2015-06-29: 4 mg via INTRAVENOUS
  Filled 2015-06-29: qty 2

## 2015-06-29 MED ORDER — FENTANYL CITRATE (PF) 100 MCG/2ML IJ SOLN
50.0000 ug | Freq: Once | INTRAMUSCULAR | Status: AC
  Administered 2015-06-29: 50 ug via INTRAVENOUS
  Filled 2015-06-29: qty 2

## 2015-06-29 MED ORDER — ONDANSETRON HCL 4 MG PO TABS: 4.0000 mg | ORAL_TABLET | Freq: Every day | ORAL | Status: DC | PRN

## 2015-06-29 MED ORDER — FAMOTIDINE 20 MG PO TABS
ORAL_TABLET | ORAL | Status: AC
  Filled 2015-06-29: qty 2

## 2015-06-29 MED ORDER — FAMOTIDINE 20 MG PO TABS
40.0000 mg | ORAL_TABLET | Freq: Once | ORAL | Status: AC
  Administered 2015-06-29: 40 mg via ORAL

## 2015-06-29 MED ORDER — ONDANSETRON HCL 4 MG/2ML IJ SOLN
4.0000 mg | Freq: Once | INTRAMUSCULAR | Status: AC | PRN
  Administered 2015-06-29: 4 mg via INTRAVENOUS
  Filled 2015-06-29: qty 2

## 2015-06-29 MED ORDER — SODIUM CHLORIDE 0.9 % IV BOLUS (SEPSIS)
1000.0000 mL | Freq: Once | INTRAVENOUS | Status: AC
  Administered 2015-06-29: 1000 mL via INTRAVENOUS

## 2015-06-29 MED ORDER — RANITIDINE HCL 150 MG PO TABS: 150.0000 mg | ORAL_TABLET | Freq: Two times a day (BID) | ORAL | Status: DC

## 2015-06-29 NOTE — ED Notes (Signed)
Pt states RUQ and epigastric pain x 3 days that is worse after eating and in the mornings. Pt states she becomes diaphoretic and has been vomiting every time after eating. Pt states she could keep fluids down until yesterday. Pt states urination 2-3 times today since waking.

## 2015-06-29 NOTE — Discharge Instructions (Signed)

## 2015-06-29 NOTE — ED Provider Notes (Signed)
[facility] Emergency Department Provider Note  ____________________________________________  Time seen: Approximately 950 PM  I have reviewed the triage vital signs and the nursing notes.   HISTORY  Chief Complaint Abdominal Pain    HPI Glenda Sloan is a 44 y.o. female with a history of diverticulosis who is presenting today with worsening epigastric pain as well as right upper quadrant abdominal pain over the past 3 days. She says that she is vomiting and becoming diaphoretic every time she eats with associated pain in the right upper quadrant. Says this pain radiates to the right side of her back as well. She says that she still has her gallbladder. The patient is currently incarcerated and is here escorted by officers. Denies any dysuria. Denies any vaginal bleeding or discharge. No noted history of kidney stones.Denies any chest pain or shortness of breath.   Past Medical History  Diagnosis Date  . Diverticulosis     There are no active problems to display for this patient.   History reviewed. No pertinent past surgical history.  Current Outpatient Rx  Name  Route  Sig  Dispense  Refill  . acetaminophen (TYLENOL) 325 MG tablet   Oral   Take 650 mg by mouth every 6 (six) hours as needed.           Allergies Review of patient's allergies indicates no known allergies.  History reviewed. No pertinent family history.  Social History Social History  Substance Use Topics  . Smoking status: Former Games developermoker  . Smokeless tobacco: None  . Alcohol Use: No    Review of Systems Constitutional: No fever/chills Eyes: No visual changes. ENT: No sore throat. Cardiovascular: Denies chest pain. Respiratory: Denies shortness of breath. Gastrointestinal:  No diarrhea.  No constipation. Genitourinary: Negative for dysuria. Musculoskeletal: Negative for back pain. Skin: Negative for rash. Neurological: Negative for headaches, focal weakness or  numbness.  10-point ROS otherwise negative.  ____________________________________________   PHYSICAL EXAM:  VITAL SIGNS: ED Triage Vitals  Enc Vitals Group     BP 06/29/15 2012 136/83 mmHg     Pulse Rate 06/29/15 2012 67     Resp 06/29/15 2012 16     Temp 06/29/15 2012 98.1 F (36.7 C)     Temp Source 06/29/15 2012 Oral     SpO2 06/29/15 2012 99 %     Weight 06/29/15 2012 190 lb (86.183 kg)     Height 06/29/15 2012 5\' 4"  (1.626 m)     Head Cir --      Peak Flow --      Pain Score 06/29/15 2013 4     Pain Loc --      Pain Edu? --      Excl. in GC? --     Constitutional: Alert and oriented. Well appearing and in no acute distress. Eyes: Conjunctivae are normal. PERRL. EOMI. Head: Atraumatic. Nose: No congestion/rhinnorhea. Mouth/Throat: Mucous membranes are moist.  Oropharynx non-erythematous. Neck: No stridor.   Cardiovascular: Normal rate, regular rhythm. Grossly normal heart sounds.  Good peripheral circulation. Respiratory: Normal respiratory effort.  No retractions. Lungs CTAB. Gastrointestinal: Soft with epigastric as well as right upper quadrant abdominal pain. There is a negative Murphy sign. No distention. No abdominal bruits. No CVA tenderness. Musculoskeletal: No lower extremity tenderness nor edema.  No joint effusions. Neurologic:  Normal speech and language. No gross focal neurologic deficits are appreciated. No gait instability. Skin:  Skin is warm, dry and intact. No rash noted. Psychiatric: Mood  and affect are normal. Speech and behavior are normal.  ____________________________________________   LABS (all labs ordered are listed, but only abnormal results are displayed)  Labs Reviewed  COMPREHENSIVE METABOLIC PANEL - Abnormal; Notable for the following:    Total Protein 8.7 (*)    All other components within normal limits  CBC - Abnormal; Notable for the following:    WBC 14.9 (*)    All other components within normal limits  URINALYSIS  COMPLETEWITH MICROSCOPIC ([facility] ONLY) - Abnormal; Notable for the following:    Color, Urine YELLOW (*)    APPearance HAZY (*)    Ketones, ur TRACE (*)    Hgb urine dipstick 1+ (*)    Squamous Epithelial / LPF 0-5 (*)    All other components within normal limits  LIPASE, BLOOD  PREGNANCY, URINE   ____________________________________________  EKG   ____________________________________________  RADIOLOGY  Normal ultrasound of the right upper quadrant. ____________________________________________   PROCEDURES   ____________________________________________   INITIAL IMPRESSION / ASSESSMENT AND PLAN / ED COURSE  Pertinent labs & imaging results that were available during my care of the patient were reviewed by me and considered in my medical decision making (see chart for details).  ----------------------------------------- 11:17 PM on 06/29/2015 -----------------------------------------  Patient is currently resting comfortably and is no longer nauseous. No outward signs of pain or distress. She has tolerated crackers and juice. I will discharge her with Zofran as well as Zantac. She says that she has a history of GERD. This may represent exacerbation of her GERD with some mild gastritis.  ____________________________________________   FINAL CLINICAL IMPRESSION(S) / ED DIAGNOSES  Final diagnoses:  RUQ abdominal pain   epigastric pain.    Myrna Blazer, MD 06/29/15 [phone]

## 2015-10-07 ENCOUNTER — Emergency Department: Payer: Self-pay

## 2015-10-07 ENCOUNTER — Encounter: Payer: Self-pay | Admitting: Emergency Medicine

## 2015-10-07 ENCOUNTER — Emergency Department
Admission: EM | Admit: 2015-10-07 | Discharge: 2015-10-07 | Disposition: A | Discharge status: Home / Self Care | Payer: Self-pay | Attending: Emergency Medicine | Admitting: Emergency Medicine

## 2015-10-07 DIAGNOSIS — L02511 Cutaneous abscess of right hand: Secondary | ICD-10-CM

## 2015-10-07 DIAGNOSIS — F17200 Nicotine dependence, unspecified, uncomplicated: Secondary | ICD-10-CM | POA: Insufficient documentation

## 2015-10-07 DIAGNOSIS — Z79899 Other long term (current) drug therapy: Secondary | ICD-10-CM | POA: Insufficient documentation

## 2015-10-07 DIAGNOSIS — L03113 Cellulitis of right upper limb: Secondary | ICD-10-CM

## 2015-10-07 LAB — CBC WITH DIFFERENTIAL/PLATELET
BASOS ABS: 0.1 10*3/uL (ref 0–0.1)
Basophils Relative: 1 %
EOS PCT: 4 %
Eosinophils Absolute: 0.5 10*3/uL (ref 0–0.7)
HEMATOCRIT: 45.1 % (ref 35.0–47.0)
Hemoglobin: 15.1 g/dL (ref 12.0–16.0)
LYMPHS PCT: 18 %
Lymphs Abs: 2.5 10*3/uL (ref 1.0–3.6)
MCH: 32.6 pg (ref 26.0–34.0)
MCHC: 33.5 g/dL (ref 32.0–36.0)
MCV: 97.1 fL (ref 80.0–100.0)
MONO ABS: 1.3 10*3/uL — AB (ref 0.2–0.9)
MONOS PCT: 10 %
NEUTROS ABS: 9.5 10*3/uL — AB (ref 1.4–6.5)
Neutrophils Relative %: 67 %
PLATELETS: 322 10*3/uL (ref 150–440)
RBC: 4.64 MIL/uL (ref 3.80–5.20)
RDW: 12.8 % (ref 11.5–14.5)
WBC: 14 10*3/uL — ABNORMAL HIGH (ref 3.6–11.0)

## 2015-10-07 LAB — BASIC METABOLIC PANEL
ANION GAP: 4 — AB (ref 5–15)
BUN: 9 mg/dL (ref 6–20)
CO2: 28 mmol/L (ref 22–32)
Calcium: 8.7 mg/dL — ABNORMAL LOW (ref 8.9–10.3)
Chloride: 104 mmol/L (ref 101–111)
Creatinine, Ser: 0.8 mg/dL (ref 0.44–1.00)
GFR calc Af Amer: >60 mL/min (ref >60)
GLUCOSE: 108 mg/dL — AB (ref 65–99)
POTASSIUM: 3.5 mmol/L (ref 3.5–5.1)
Sodium: 136 mmol/L (ref 135–145)

## 2015-10-07 MED ORDER — HYDROCODONE-ACETAMINOPHEN 5-325 MG PO TABS: 1.0000 | ORAL_TABLET | Freq: Four times a day (QID) | ORAL | Status: DC | PRN

## 2015-10-07 MED ORDER — HYDROCODONE-ACETAMINOPHEN 5-325 MG PO TABS
1.0000 | ORAL_TABLET | Freq: Once | ORAL | Status: AC
  Administered 2015-10-07: 1 via ORAL
  Filled 2015-10-07: qty 1

## 2015-10-07 MED ORDER — IBUPROFEN 800 MG PO TABS: 800.0000 mg | ORAL_TABLET | Freq: Three times a day (TID) | ORAL | Status: DC | PRN

## 2015-10-07 MED ORDER — CLINDAMYCIN PHOSPHATE 300 MG/50ML IV SOLN
300.0000 mg | Freq: Once | INTRAVENOUS | Status: AC
  Administered 2015-10-07: 300 mg via INTRAVENOUS
  Filled 2015-10-07: qty 50

## 2015-10-07 MED ORDER — CLINDAMYCIN HCL 300 MG PO CAPS: 300.0000 mg | ORAL_CAPSULE | Freq: Three times a day (TID) | ORAL | Status: DC

## 2015-10-07 NOTE — ED Provider Notes (Signed)
[facility] Emergency Department Provider Note  ____________________________________________  Time seen: Approximately 10:30 AM  I have reviewed the triage vital signs and the nursing notes.   HISTORY  Chief Complaint Abscess    HPI Glenda Sloan is a 45 y.o. female , NAD, presents to the emergency department with redness, pain and swelling in the right hand. States she has had a cyst on the hand for a couple of months. Notes drainage about a week ago from another part of the swelling. Has had increased pain to the area over the last 2-3 days. Denies any injuries or traumas. Has had no fever, chills, body aches. Denies abdominal pain, nausea, vomiting. Is uninsured and does not have a primary care provider and has not sought medical treatment for such in the past.   Past Medical History  Diagnosis Date  . Diverticulosis     There are no active problems to display for this patient.   No past surgical history on file.  Current Outpatient Rx  Name  Route  Sig  Dispense  Refill  . acetaminophen (TYLENOL) 325 MG tablet   Oral   Take 650 mg by mouth every 6 (six) hours as needed.         . clindamycin (CLEOCIN) 300 MG capsule   Oral   Take 1 capsule (300 mg total) by mouth 3 (three) times daily.   30 capsule   0   . HYDROcodone-acetaminophen (NORCO) 5-325 MG tablet   Oral   Take 1-2 tablets by mouth every 6 (six) hours as needed for severe pain.   12 tablet   0   . ibuprofen (ADVIL,MOTRIN) 800 MG tablet   Oral   Take 1 tablet (800 mg total) by mouth every 8 (eight) hours as needed (pain).   60 tablet   0   . ondansetron (ZOFRAN) 4 MG tablet   Oral   Take 1 tablet (4 mg total) by mouth daily as needed for nausea or vomiting.   10 tablet   1   . ranitidine (ZANTAC) 150 MG tablet   Oral   Take 1 tablet (150 mg total) by mouth 2 (two) times daily.   60 tablet   0     Allergies Review of patient's allergies indicates no known  allergies.  No family history on file.  Social History Social History  Substance Use Topics  . Smoking status: Current Every Day Smoker  . Smokeless tobacco: None  . Alcohol Use: Yes     Review of Systems  Constitutional: No fever/chills Cardiovascular: No chest pain. Respiratory: No shortness of breath. No wheezing.  Gastrointestinal: No abdominal pain.  No nausea, vomiting.  No diarrhea.   Musculoskeletal: Positive right hand and thumb pain. Negative for back pain.  Skin: Positive swelling, redness, bruising to right hand and thumb. Negative for rash. Neurological: Negative for headaches, focal weakness or numbness. 10-point ROS otherwise negative.  ____________________________________________   PHYSICAL EXAM:  VITAL SIGNS: ED Triage Vitals  Enc Vitals Group     BP 10/07/15 0932 135/87 mmHg     Pulse Rate 10/07/15 0932 67     Resp 10/07/15 0932 14     Temp 10/07/15 0932 97.9 F (36.6 C)     Temp src --      SpO2 10/07/15 0932 97 %     Weight 10/07/15 0932 194 lb (87.998 kg)     Height 10/07/15 0932 5\' 4"  (1.626 m)     Head  Cir --      Peak Flow --      Pain Score 10/07/15 0933 8     Pain Loc --      Pain Edu? --      Excl. in GC? --     Constitutional: Alert and oriented. Well appearing and in no acute distress. Eyes: Conjunctivae are normal.  Head: Atraumatic. Neck: Supple with full range of motion. Hematological/Lymphatic/Immunilogical: No cervical lymphadenopathy. Cardiovascular: Normal rate, regular rhythm. Normal S1 and S2.  Good peripheral circulation. Respiratory: Normal respiratory effort without tachypnea or retractions. Lungs CTAB. Musculoskeletal: Significant swelling and tenderness about the base of the right thumb. Decreased range of motion of the right thumb due to pain and swelling. No crepitus to manipulation of the right thumb. Full range of motion of the right wrist. Neurologic:  Normal speech and language. No gross focal neurologic deficits  are appreciated.  Skin:  Significant swelling, erythema, warmth to the skin about the right back of the hand as well as the base of the right thumb. The central area of pallor with fluctuance noted about the area of swelling. Another wound with scabbing noted medial to the fluctuant area. Active oozing or weeping. Skin is warm, dry and intact.  Psychiatric: Mood and affect are normal. Speech and behavior are normal. Patient exhibits appropriate insight and judgement.   ____________________________________________   LABS (all labs ordered are listed, but only abnormal results are displayed)  Labs Reviewed  BASIC METABOLIC PANEL - Abnormal; Notable for the following:    Glucose, Bld 108 (*)    Calcium 8.7 (*)    Anion gap 4 (*)    All other components within normal limits  CBC WITH DIFFERENTIAL/PLATELET - Abnormal; Notable for the following:    WBC 14.0 (*)    Neutro Abs 9.5 (*)    Monocytes Absolute 1.3 (*)    All other components within normal limits  ANAEROBIC CULTURE  WOUND CULTURE   ____________________________________________  EKG  None ____________________________________________  RADIOLOGY I have personally viewed and evaluated these images (plain radiographs) as part of my medical decision making, as well as reviewing the written report by the radiologist.  Dg Hand Complete Right  10/07/2015  CLINICAL DATA:  Swelling and redness with pain EXAM: RIGHT HAND - COMPLETE 3+ VIEW COMPARISON:  None. FINDINGS: Frontal, oblique, and lateral views were obtained. There is marked soft tissue swelling along the palmar aspect of the hand laterally. There is no calcification or air in this area of marked swelling. There is no fracture or dislocation. No erosive change or bony destruction. The joint spaces appear normal. IMPRESSION: Marked soft tissue swelling along the palmar aspect of the hand laterally. Mass or abscess in this area are differential considerations. There is no air or  calcification in this lesion. No bony abnormality. If soft tissue mass is felt to be most likely based on clinical findings and further imaging evaluation is felt to be warranted, MRI pre and post-contrast would be the imaging study to further assess. Electronically Signed   By: Bretta Bang III M.D.   On: 10/07/2015 10:57    ____________________________________________    PROCEDURES  Procedure(s) performed: INCISION AND DRAINAGE Performed by: Hope Pigeon Consent: Verbal consent obtained. Risks and benefits: risks, benefits and alternatives were discussed Type: abscess  Body area: right hand at base of thumb  Anesthesia: local infiltration  Incision was made with a scalpel.  Local anesthetic: lidocaine 1% with epinephrine  Anesthetic total: 1.5  ml  Complexity: complex Blunt dissection to break up loculations  Drainage: purulent  Drainage amount: 5cc  Packing material: none  Patient tolerance: Patient tolerated the procedure well with no immediate complications.      Medications  HYDROcodone-acetaminophen (NORCO/VICODIN) 5-325 MG per tablet 1 tablet (1 tablet Oral Given 10/07/15 1048)  clindamycin (CLEOCIN) IVPB 300 mg (0 mg Intravenous Stopped 10/07/15 1231)     ____________________________________________   INITIAL IMPRESSION / ASSESSMENT AND PLAN / ED COURSE  Pertinent labs & imaging results that were available during my care of the patient were reviewed by me and considered in my medical decision making (see chart for details). Wound culture is pending at time of discharge.   Patient's diagnosis is consistent with cellulitis and abscess of right hand. Patient tolerated medications and procedure well during her ED course. She notes decreased pain about the right hand at the time of discharge. Patient will be discharged home with prescriptions for clindamycin, Norco, ibuprofen to take as directed. [facility]  [place] for wound recheck in 48 hours. Patient is given ED precautions to return to the ED for any worsening or new symptoms.    ____________________________________________  FINAL CLINICAL IMPRESSION(S) / ED DIAGNOSES  Final diagnoses:  Abscess of right hand  Cellulitis of right hand      NEW MEDICATIONS STARTED DURING THIS VISIT:  New Prescriptions   CLINDAMYCIN (CLEOCIN) 300 MG CAPSULE    Take 1 capsule (300 mg total) by mouth 3 (three) times daily.   HYDROCODONE-ACETAMINOPHEN (NORCO) 5-325 MG TABLET    Take 1-2 tablets by mouth every 6 (six) hours as needed for severe pain.   IBUPROFEN (ADVIL,MOTRIN) 800 MG TABLET    Take 1 tablet (800 mg total) by mouth every 8 (eight) hours as needed (pain).         Hope Pigeon, PA-C 10/07/15 1306  Jene Every, MD 10/07/15 [phone]

## 2015-10-07 NOTE — ED Notes (Signed)
Pt to ed with c/o pain in right hand thumb area and palm.  Pt reports she was seen here in December for abscess to hand, reports it was drained and felt better after a few days, however about 1 week ago, area started to become red again and swelling started.  Pt reports increased pain with movement of hand. +pulse,.

## 2015-10-07 NOTE — ED Notes (Signed)
Has had cyst on right hand and was seen here in December.  Now it is swollen and has a  White head onit.  Says it busted open in a different place aobut a week ago and drained a large amount

## 2015-10-07 NOTE — Discharge Instructions (Signed)
Abscess °An abscess is an infected area that contains a collection of pus and debris. It can occur in almost any part of the body. An abscess is also known as a furuncle or boil. °CAUSES  °An abscess occurs when tissue gets infected. This can occur from blockage of oil or sweat glands, infection of hair follicles, or a minor injury to the skin. As the body tries to fight the infection, pus collects in the area and creates pressure under the skin. This pressure causes pain. People with weakened immune systems have difficulty fighting infections and get certain abscesses more often.  °SYMPTOMS °Usually an abscess develops on the skin and becomes a painful mass that is red, warm, and tender. If the abscess forms under the skin, you may feel a moveable soft area under the skin. Some abscesses break open (rupture) on their own, but most will continue to get worse without care. The infection can spread deeper into the body and eventually into the bloodstream, causing you to feel ill.  °DIAGNOSIS  °Your caregiver will take your medical history and perform a physical exam. A sample of fluid may also be taken from the abscess to determine what is causing your infection. °TREATMENT  °Your caregiver may prescribe antibiotic medicines to fight the infection. However, taking antibiotics alone usually does not cure an abscess. Your caregiver may need to make a small cut (incision) in the abscess to drain the pus. In some cases, gauze is packed into the abscess to reduce pain and to continue draining the area. °HOME CARE INSTRUCTIONS  °· Only take over-the-counter or prescription medicines for pain, discomfort, or fever as directed by your caregiver. °· If you were prescribed antibiotics, take them as directed. Finish them even if you start to feel better. °· If gauze is used, follow your caregiver's directions for changing the gauze. °· To avoid spreading the infection: °· Keep your draining abscess covered with a  bandage. °· Wash your hands well. °· Do not share personal care items, towels, or whirlpools with others. °· Avoid skin contact with others. °· Keep your skin and clothes clean around the abscess. °· Keep all follow-up appointments as directed by your caregiver. °SEEK MEDICAL CARE IF:  °· You have increased pain, swelling, redness, fluid drainage, or bleeding. °· You have muscle aches, chills, or a general ill feeling. °· You have a fever. °MAKE SURE YOU:  °· Understand these instructions. °· Will watch your condition. °· Will get help right away if you are not doing well or get worse. °  °This information is not intended to replace advice given to you by your health care provider. Make sure you discuss any questions you have with your health care provider. °  °Document Released: 04/21/2005 Document Revised: 01/11/2012 Document Reviewed: 09/24/2011 °Elsevier Interactive Patient Education ©2016 [employer] ° °Incision and Drainage °Incision and drainage is a procedure in which a sac-like structure (cystic structure) is opened and drained. The area to be drained usually contains material such as pus, fluid, or blood.  °LET YOUR CAREGIVER KNOW ABOUT:  °· Allergies to medicine. °· Medicines taken, including vitamins, herbs, eyedrops, over-the-counter medicines, and creams. °· Use of steroids (by mouth or creams). °· Previous problems with anesthetics or numbing medicines. °· History of bleeding problems or blood clots. °· Previous surgery. °· Other health problems, including diabetes and kidney problems. °· Possibility of pregnancy, if this applies. °RISKS AND COMPLICATIONS °· Pain. °· Bleeding. °· Scarring. °· Infection. °BEFORE THE PROCEDURE  °  You may need to have an ultrasound or other imaging tests to see how large or deep your cystic structure is. Blood tests may also be used to determine if you have an infection or how severe the infection is. You may need to have a tetanus shot. °PROCEDURE  °The affected area  is cleaned with a cleaning fluid. The cyst area will then be numbed with a medicine (local anesthetic). A small incision will be made in the cystic structure. A syringe or catheter may be used to drain the contents of the cystic structure, or the contents may be squeezed out. The area will then be flushed with a cleansing solution. After cleansing the area, it is often gently packed with a gauze or another wound dressing. Once it is packed, it will be covered with gauze and tape or some other type of wound dressing.  °AFTER THE PROCEDURE  °· Often, you will be allowed to go home right after the procedure. °· You may be given antibiotic medicine to prevent or heal an infection. °· If the area was packed with gauze or some other wound dressing, you will likely need to come back in 1 to 2 days to get it removed. °· The area should heal in about 14 days. °  °This information is not intended to replace advice given to you by your health care provider. Make sure you discuss any questions you have with your health care provider. °  °Document Released: 01/05/2001 Document Revised: 01/11/2012 Document Reviewed: 09/06/2011 °Elsevier Interactive Patient Education ©2016 [employer] Inc[employer] ° °

## 2015-10-10 LAB — WOUND CULTURE: SPECIAL REQUESTS: NORMAL

## 2015-10-11 LAB — ANAEROBIC CULTURE: SPECIAL REQUESTS: NORMAL

## 2015-10-25 ENCOUNTER — Emergency Department
Admission: EM | Admit: 2015-10-25 | Discharge: 2015-10-26 | Disposition: A | Discharge status: Home / Self Care | Payer: Self-pay | Attending: Emergency Medicine | Admitting: Emergency Medicine

## 2015-10-25 ENCOUNTER — Encounter: Payer: Self-pay | Admitting: Emergency Medicine

## 2015-10-25 ENCOUNTER — Emergency Department: Payer: Self-pay

## 2015-10-25 DIAGNOSIS — A5909 Other urogenital trichomoniasis: Secondary | ICD-10-CM | POA: Insufficient documentation

## 2015-10-25 DIAGNOSIS — N83201 Unspecified ovarian cyst, right side: Secondary | ICD-10-CM | POA: Insufficient documentation

## 2015-10-25 DIAGNOSIS — R52 Pain, unspecified: Secondary | ICD-10-CM

## 2015-10-25 DIAGNOSIS — R102 Pelvic and perineal pain: Secondary | ICD-10-CM

## 2015-10-25 DIAGNOSIS — N3081 Other cystitis with hematuria: Secondary | ICD-10-CM | POA: Insufficient documentation

## 2015-10-25 DIAGNOSIS — F17200 Nicotine dependence, unspecified, uncomplicated: Secondary | ICD-10-CM | POA: Insufficient documentation

## 2015-10-25 DIAGNOSIS — R319 Hematuria, unspecified: Secondary | ICD-10-CM

## 2015-10-25 DIAGNOSIS — Z79899 Other long term (current) drug therapy: Secondary | ICD-10-CM | POA: Insufficient documentation

## 2015-10-25 LAB — COMPREHENSIVE METABOLIC PANEL
ALT: 50 U/L (ref 14–54)
AST: 28 U/L (ref 15–41)
Albumin: 3.3 g/dL — ABNORMAL LOW (ref 3.5–5.0)
Alkaline Phosphatase: 129 U/L — ABNORMAL HIGH (ref 38–126)
Anion gap: 8 (ref 5–15)
BILIRUBIN TOTAL: 1.5 mg/dL — AB (ref 0.3–1.2)
BUN: 8 mg/dL (ref 6–20)
CALCIUM: 8.4 mg/dL — AB (ref 8.9–10.3)
CO2: 26 mmol/L (ref 22–32)
CREATININE: 1.01 mg/dL — AB (ref 0.44–1.00)
Chloride: 97 mmol/L — ABNORMAL LOW (ref 101–111)
Glucose, Bld: 117 mg/dL — ABNORMAL HIGH (ref 65–99)
Potassium: 2.6 mmol/L — CL (ref 3.5–5.1)
Sodium: 131 mmol/L — ABNORMAL LOW (ref 135–145)
TOTAL PROTEIN: 7.7 g/dL (ref 6.5–8.1)

## 2015-10-25 LAB — URINALYSIS COMPLETE WITH MICROSCOPIC (ARMC ONLY)
BILIRUBIN URINE: NEGATIVE
Bacteria, UA: NONE SEEN
GLUCOSE, UA: NEGATIVE mg/dL
KETONES UR: NEGATIVE mg/dL
Nitrite: NEGATIVE
Protein, ur: 30 mg/dL — AB
Specific Gravity, Urine: 1.015 (ref 1.005–1.030)
pH: 6 (ref 5.0–8.0)

## 2015-10-25 LAB — POCT PREGNANCY, URINE: Preg Test, Ur: NEGATIVE

## 2015-10-25 LAB — WET PREP, GENITAL
SPERM: NONE SEEN
YEAST WET PREP: NONE SEEN

## 2015-10-25 LAB — CBC
HCT: 41 % (ref 35.0–47.0)
Hemoglobin: 13.9 g/dL (ref 12.0–16.0)
MCH: 32.8 pg (ref 26.0–34.0)
MCHC: 33.9 g/dL (ref 32.0–36.0)
MCV: 96.8 fL (ref 80.0–100.0)
PLATELETS: 186 10*3/uL (ref 150–440)
RBC: 4.23 MIL/uL (ref 3.80–5.20)
RDW: 12.6 % (ref 11.5–14.5)
WBC: 18.1 10*3/uL — ABNORMAL HIGH (ref 3.6–11.0)

## 2015-10-25 LAB — LIPASE, BLOOD: LIPASE: 13 U/L (ref 11–51)

## 2015-10-25 MED ORDER — POTASSIUM CHLORIDE CRYS ER 20 MEQ PO TBCR
40.0000 meq | EXTENDED_RELEASE_TABLET | Freq: Once | ORAL | Status: AC
  Administered 2015-10-25: 40 meq via ORAL
  Filled 2015-10-25: qty 2

## 2015-10-25 MED ORDER — METRONIDAZOLE 500 MG PO TABS: 500.0000 mg | ORAL_TABLET | Freq: Two times a day (BID) | ORAL | Status: DC

## 2015-10-25 NOTE — ED Provider Notes (Signed)
[facility] Emergency Department Provider Note  ____________________________________________    I have reviewed the triage vital signs and the nursing notes.   HISTORY  Chief Complaint Pelvic Pain    HPI Glenda Sloan is a 45 y.o. female who presents with complaints of pelvic discomfort. She reports she has occasionally felt nauseous from it. She reports it as a burning sensation in her pelvis. She denies flank pain. No diarrhea. Normal bowel movements. No fevers or chills. She has been with the same partner for 6 years. She denies vaginal discharge. No dysuria.     Past Medical History  Diagnosis Date  . Diverticulosis     There are no active problems to display for this patient.   History reviewed. No pertinent past surgical history.  Current Outpatient Rx  Name  Route  Sig  Dispense  Refill  . acetaminophen (TYLENOL) 325 MG tablet   Oral   Take 650 mg by mouth every 6 (six) hours as needed.           Allergies Review of patient's allergies indicates no known allergies.  History reviewed. No pertinent family history.  Social History Social History  Substance Use Topics  . Smoking status: Current Every Day Smoker  . Smokeless tobacco: None  . Alcohol Use: Yes    Review of Systems  Constitutional: Negative for fever. Eyes: Negative for redness ENT: Negative for sore throat Cardiovascular: Negative for chest pain Respiratory: Negative for shortness of breath. Gastrointestinal: Negative for abdominal pain Genitourinary: Negative for dysuria.No vaginal discharge, pelvic pain as above Musculoskeletal: Negative for back pain. Skin: Negative for rash. Neurological: Negative for headache Psychiatric: no anxiety    ____________________________________________   PHYSICAL EXAM:  VITAL SIGNS: ED Triage Vitals  Enc Vitals Group     BP 10/25/15 1947 115/63 mmHg     Pulse Rate 10/25/15 1947 84     Resp 10/25/15 1947 20     Temp  10/25/15 1947 98.8 F (37.1 C)     Temp Source 10/25/15 1947 Oral     SpO2 10/25/15 1947 99 %     Weight 10/25/15 1947 194 lb (87.998 kg)     Height 10/25/15 1947  (1.626 m)     Head Cir --      Peak Flow --      Pain Score 10/25/15 1948 8     Pain Loc --      Pain Edu? --      Excl. in GC? --      Constitutional: Alert and oriented. Well appearing and in no distress.  Eyes: Conjunctivae are normal. No erythema or injection ENT   Head: Normocephalic and atraumatic.   Mouth/Throat: Mucous membranes are moist. Cardiovascular: Normal rate, regular rhythm. Normal and symmetric distal pulses are present in the upper extremities. Respiratory: Normal respiratory effort without tachypnea nor retractions. Breath sounds are clear and equal bilaterally.  Gastrointestinal: Soft and non-tender in all quadrants. No distention. There is no CVA tenderness. Genitourinary: Mild cervicitis, no CMT, thick yellowish discharge Musculoskeletal: Nontender with normal range of motion in all extremities. No lower extremity tenderness nor edema. Neurologic:  Normal speech and language. No gross focal neurologic deficits are appreciated. Skin:  Skin is warm, dry and intact. No rash noted. Psychiatric: Mood and affect are normal. Patient exhibits appropriate insight and judgment.  ____________________________________________    LABS (pertinent positives/negatives)  Labs Reviewed  WET PREP, GENITAL - Abnormal; Notable for the following:  Trich, Wet Prep PRESENT (*)    Clue Cells Wet Prep HPF POC PRESENT (*)    WBC, Wet Prep HPF POC FEW (*)    All other components within normal limits  CBC - Abnormal; Notable for the following:    WBC 18.1 (*)    All other components within normal limits  COMPREHENSIVE METABOLIC PANEL - Abnormal; Notable for the following:    Sodium 131 (*)    Potassium 2.6 (*)    Chloride 97 (*)    Glucose, Bld 117 (*)    Creatinine, Ser 1.01 (*)    Calcium 8.4 (*)     Albumin 3.3 (*)    Alkaline Phosphatase 129 (*)    Total Bilirubin 1.5 (*)    All other components within normal limits  URINALYSIS COMPLETEWITH MICROSCOPIC ([facility] ONLY) - Abnormal; Notable for the following:    Color, Urine AMBER (*)    APPearance HAZY (*)    Hgb urine dipstick 3+ (*)    Protein, ur 30 (*)    Leukocytes, UA TRACE (*)    Squamous Epithelial / LPF 6-30 (*)    All other components within normal limits  CHLAMYDIA/NGC RT PCR ([facility] ONLY)  LIPASE, BLOOD  POCT PREGNANCY, URINE    ____________________________________________   EKG  ED ECG REPORT I, Jene EveryKINNER, Erynn Vaca, the attending physician, personally viewed and interpreted this ECG.  Date: 10/25/2015 EKG Time: 7:49 PM Rate: 78 Rhythm: normal sinus rhythm QRS Axis: normal Intervals: normal ST/T Wave abnormalities: normal Conduction Disturbances: none Narrative Interpretation: unremarkable   ____________________________________________    RADIOLOGY  CT renal stone study  ____________________________________________   PROCEDURES  Procedure(s) performed: none  Critical Care performed: none  ____________________________________________   INITIAL IMPRESSION / ASSESSMENT AND PLAN / ED COURSE  Pertinent labs & imaging results that were available during my care of the patient were reviewed by me and considered in my medical decision making (see chart for details).  GYN exam is suspicious for cervicitis. Wet prep confirms trichomonal infection. Patient also has hematuria so I sent her for a CT renal stone study. She appears to have a chronically elevated white blood cell count. She is afebrile. She is not tachycardic. She was given by mouth potassium.  I have asked Dr. Dolores FrameSung to follow up on CT scan   ____________________________________________   FINAL CLINICAL IMPRESSION(S) / ED DIAGNOSES  Final diagnoses:  Hematuria  Trichomonal cervicitis          Jene Everyobert Naleah Kofoed, MD 10/27/15  1453

## 2015-10-25 NOTE — ED Notes (Signed)
Pt. States intermittent pelvic pain for 3 days with vomiting.  Pt. States increased pelvic pain today.

## 2015-10-25 NOTE — Discharge Instructions (Signed)
Trichomoniasis Trichomoniasis is an infection caused by an organism called Trichomonas. The infection can affect both women and men. In women, the outer female genitalia and the vagina are affected. In men, the penis is mainly affected, but the prostate and other reproductive organs can also be involved. Trichomoniasis is a sexually transmitted infection (STI) and is most often passed to another person through sexual contact.  RISK FACTORS  Having unprotected sexual intercourse.  Having sexual intercourse with an infected partner. SIGNS AND SYMPTOMS  Symptoms of trichomoniasis in women include:  Abnormal gray-green frothy vaginal discharge.  Itching and irritation of the vagina.  Itching and irritation of the area outside the vagina. Symptoms of trichomoniasis in men include:   Penile discharge with or without pain.  Pain during urination. This results from inflammation of the urethra. DIAGNOSIS  Trichomoniasis may be found during a Pap test or physical exam. Your health care provider may use one of the following methods to help diagnose this infection:  Testing the pH of the vagina with a test tape.  Using a vaginal swab test that checks for the Trichomonas organism. A test is available that provides results within a few minutes.  Examining a urine sample.  Testing vaginal secretions. Your health care provider may test you for other STIs, including HIV. TREATMENT   You may be given medicine to fight the infection. Women should inform their health care provider if they could be or are pregnant. Some medicines used to treat the infection should not be taken during pregnancy.  Your health care provider may recommend over-the-counter medicines or creams to decrease itching or irritation.  Your sexual partner will need to be treated if infected.  Your health care provider may test you for infection again 3 months after treatment. HOME CARE INSTRUCTIONS   Take medicines only as  directed by your health care provider.  Take over-the-counter medicine for itching or irritation as directed by your health care provider.  Do not have sexual intercourse while you have the infection.  Women should not douche or wear tampons while they have the infection.  Discuss your infection with your partner. Your partner may have gotten the infection from you, or you may have gotten it from your partner.  Have your sex partner get examined and treated if necessary.  Practice safe, informed, and protected sex.  See your health care provider for other STI testing. SEEK MEDICAL CARE IF:   You still have symptoms after you finish your medicine.  You develop abdominal pain.  You have pain when you urinate.  You have bleeding after sexual intercourse.  You develop a rash.  Your medicine makes you sick or makes you throw up (vomit). MAKE SURE YOU:  Understand these instructions.  Will watch your condition.  Will get help right away if you are not doing well or get worse.   This information is not intended to replace advice given to you by your health care provider. Make sure you discuss any questions you have with your health care provider.   Document Released: 01/05/2001 Document Revised: 08/02/2014 Document Reviewed: 04/23/2013 Elsevier Interactive Patient Education 2016 [employer] Inc.  

## 2015-10-26 ENCOUNTER — Emergency Department: Payer: Self-pay

## 2015-10-26 LAB — CHLAMYDIA/NGC RT PCR (ARMC ONLY)
Chlamydia Tr: NOT DETECTED
N gonorrhoeae: NOT DETECTED

## 2015-10-26 NOTE — ED Provider Notes (Signed)
-----------------------------------------   1:15 AM on 10/26/2015 -----------------------------------------  CT renal stone study interpreted per Dr. Cherly Hensenhang: 1. Minimal soft tissue inflammation about the bladder could reflect mild cystitis. 2. Somewhat enlarged right ovary, measuring 6.0 cm, with mild soft tissue inflammation about the right adnexa. Would correlate with the patient's symptoms, and suggest pelvic ultrasound for further Evaluation.  Updated patient of CT results. We'll order a pelvic ultrasound to further evaluate right ovary.  ----------------------------------------- 3:19 AM on 10/26/2015 -----------------------------------------  Pelvic ultrasound interpreted per Dr. Cherly Hensenhang: 1. Uterus unremarkable in appearance. 2. Asymmetric enlargement of the right ovary. Areas of complex fluid noted tracking within the right ovary, measuring up to 3.2 cm in size. No significant hyperemia seen. This is of uncertain significance. These may reflect multiple hemorrhagic cysts or possibly endometriomas. MRI could be considered for further evaluation, depending on the patient's symptoms. 3. No evidence for ovarian torsion.  Patient is sleeping in no acute distress. Reports history of ovarian cysts. Area surrounding right ovary does not appear to be tubo-ovarian abscess. We'll proceed with discharge plans per Dr. Cyril LoosenKinner; prescription for Flagyl and follow up with GYN. Strict return precautions given. Patient verbalizes understanding and agrees with plan of care.  Glenda HongJade J Sue Mcalexander, MD 10/26/15 [phone]

## 2015-10-27 ENCOUNTER — Emergency Department
Admission: EM | Admit: 2015-10-27 | Discharge: 2015-10-27 | Disposition: A | Discharge status: Home / Self Care | Payer: Self-pay | Attending: Student | Admitting: Student

## 2015-10-27 ENCOUNTER — Encounter: Payer: Self-pay | Admitting: Emergency Medicine

## 2015-10-27 ENCOUNTER — Emergency Department: Payer: Self-pay

## 2015-10-27 DIAGNOSIS — R1111 Vomiting without nausea: Secondary | ICD-10-CM | POA: Insufficient documentation

## 2015-10-27 DIAGNOSIS — F17200 Nicotine dependence, unspecified, uncomplicated: Secondary | ICD-10-CM | POA: Insufficient documentation

## 2015-10-27 DIAGNOSIS — R1013 Epigastric pain: Secondary | ICD-10-CM | POA: Insufficient documentation

## 2015-10-27 LAB — BASIC METABOLIC PANEL
Anion gap: 13 (ref 5–15)
BUN: 8 mg/dL (ref 6–20)
CHLORIDE: 93 mmol/L — AB (ref 101–111)
CO2: 24 mmol/L (ref 22–32)
Calcium: 8.6 mg/dL — ABNORMAL LOW (ref 8.9–10.3)
Creatinine, Ser: 0.8 mg/dL (ref 0.44–1.00)
GFR calc Af Amer: >60 mL/min (ref >60)
GFR calc non Af Amer: >60 mL/min (ref >60)
GLUCOSE: 105 mg/dL — AB (ref 65–99)
POTASSIUM: 2.5 mmol/L — AB (ref 3.5–5.1)
Sodium: 130 mmol/L — ABNORMAL LOW (ref 135–145)

## 2015-10-27 LAB — CBC
HEMATOCRIT: 43.8 % (ref 35.0–47.0)
Hemoglobin: 15 g/dL (ref 12.0–16.0)
MCH: 32.2 pg (ref 26.0–34.0)
MCHC: 34.2 g/dL (ref 32.0–36.0)
MCV: 94.3 fL (ref 80.0–100.0)
Platelets: 229 10*3/uL (ref 150–440)
RBC: 4.65 MIL/uL (ref 3.80–5.20)
RDW: 12.4 % (ref 11.5–14.5)
WBC: 18.2 10*3/uL — ABNORMAL HIGH (ref 3.6–11.0)

## 2015-10-27 LAB — TROPONIN I
Troponin I: <0.03 ng/mL (ref <0.031)
Troponin I: <0.03 ng/mL (ref <0.031)

## 2015-10-27 MED ORDER — POTASSIUM CHLORIDE CRYS ER 20 MEQ PO TBCR
40.0000 meq | EXTENDED_RELEASE_TABLET | Freq: Once | ORAL | Status: AC
  Administered 2015-10-27: 40 meq via ORAL
  Filled 2015-10-27: qty 2

## 2015-10-27 MED ORDER — ONDANSETRON 4 MG PO TBDP: 4.0000 mg | ORAL_TABLET | Freq: Three times a day (TID) | ORAL | Status: DC | PRN

## 2015-10-27 MED ORDER — ONDANSETRON 4 MG PO TBDP
4.0000 mg | ORAL_TABLET | Freq: Once | ORAL | Status: AC | PRN
  Administered 2015-10-27: 4 mg via ORAL
  Filled 2015-10-27: qty 1

## 2015-10-27 MED ORDER — OXYCODONE-ACETAMINOPHEN 5-325 MG PO TABS
ORAL_TABLET | ORAL | Status: AC
  Filled 2015-10-27: qty 1

## 2015-10-27 MED ORDER — OXYCODONE-ACETAMINOPHEN 5-325 MG PO TABS
1.0000 | ORAL_TABLET | ORAL | Status: DC | PRN
  Administered 2015-10-27: 1 via ORAL

## 2015-10-27 NOTE — ED Notes (Signed)
Patient with complaint of central chest pain that started about an hour ago. Patient reports that she has been vomiting since Thursday. Patient reports that she was seen her yesterday and diagnosed with ovarian cyst and inflamed cervix. Patient states that she has taken one dose of the medication.

## 2015-10-27 NOTE — ED Provider Notes (Signed)
[facility] Emergency Department Provider Note  ____________________________________________  Time seen: Approximately 7:14 AM  I have reviewed the triage vital signs and the nursing notes.   HISTORY  Chief Complaint Chest Pain and Emesis    HPI Glenda Sloan is a 45 y.o. female with no chronic medical problems who presents for evaluation of less than 12 hours of epigastric pain radiating into the chest and towards the back, gradual onset, now resolved after she receives oxycodone and Zofran in the emergency department, began shortly after vomiting, no other modifying factors. The patient was seen in this emergency  Department on 10/25/2015 for evaluation of nausea, vomiting and pelvic pain. She had a CT scan, pelvic exam as well as pelvic ultrasound which were remarkable for ovarian cyst as well as Trichomonas and bacterial vaginosis. She was discharged with Flagyl. She reports she has taken one dose of the Flagyl however last night she continued to be nauseated and she vomited. At10 PM she developed this epigastric pain. No shortness of breath. No fevers or chills. No diarrhea. No personal or family history of early coronary artery disease. No personal or family history of PE or DVT, no leg swelling, no hemoptysis, no exogenous estrogen use, no recent surgeries or recent prolonged period of immobilization.   Past Medical History  Diagnosis Date  . Diverticulosis     There are no active problems to display for this patient.   History reviewed. No pertinent past surgical history.  Current Outpatient Rx  Name  Route  Sig  Dispense  Refill  . acetaminophen (TYLENOL) 325 MG tablet   Oral   Take 650 mg by mouth every 6 (six) hours as needed.         . metroNIDAZOLE (FLAGYL) 500 MG tablet   Oral   Take 1 tablet (500 mg total) by mouth 2 (two) times daily after a meal.   14 tablet   0     Allergies Review of patient's allergies indicates no known  allergies.  No family history on file.  Social History Social History  Substance Use Topics  . Smoking status: Current Every Day Smoker  . Smokeless tobacco: None  . Alcohol Use: Yes    Review of Systems Constitutional: No fever/chills Eyes: No visual changes. ENT: No sore throat. Cardiovascular: + chest pain. Respiratory: Denies shortness of breath. Gastrointestinal: + abdominal pain.  + nausea, + vomiting.  No diarrhea.  No constipation. Genitourinary: Negative for dysuria. Musculoskeletal: Negative for back pain. Skin: Negative for rash. Neurological: Negative for headaches, focal weakness or numbness.  10-point ROS otherwise negative.  ____________________________________________   PHYSICAL EXAM:  VITAL SIGNS: ED Triage Vitals  Enc Vitals Group     BP 10/27/15 0137 144/83 mmHg     Pulse Rate 10/27/15 0137 87     Resp 10/27/15 0137 20     Temp 10/27/15 0137 98.7 F (37.1 C)     Temp Source 10/27/15 0137 Oral     SpO2 10/27/15 0137 96 %     Weight 10/27/15 0133 194 lb (87.998 kg)     Height 10/27/15 0133  (1.626 m)     Head Cir --      Peak Flow --      Pain Score 10/27/15 0134 10     Pain Loc --      Pain Edu? --      Excl. in GC? --     Constitutional: Alert and oriented. Well appearing and  in no acute distress. Eyes: Conjunctivae are normal. PERRL. EOMI. Head: Atraumatic. Nose: No congestion/rhinnorhea. Mouth/Throat: Mucous membranes are moist.  Oropharynx non-erythematous. Neck: No stridor.  Supple without meningismus. Cardiovascular: Normal rate, regular rhythm. Grossly normal heart sounds.  Good peripheral circulation. Respiratory: Normal respiratory effort.  No retractions. Lungs CTAB. Gastrointestinal: Soft and nontender. No distention. No Murphy's sign. No CVA tenderness. Genitourinary: deferred Musculoskeletal: No lower extremity tenderness nor edema.  No joint effusions.No calf swelling, asymmetry or tenderness.  Neurologic:  Normal  speech and language. No gross focal neurologic deficits are appreciated. No gait instability. Skin:  Skin is warm, dry and intact. No rash noted. Psychiatric: Mood and affect are normal. Speech and behavior are normal.  ____________________________________________   LABS (all labs ordered are listed, but only abnormal results are displayed)  Labs Reviewed  BASIC METABOLIC PANEL - Abnormal; Notable for the following:    Sodium 130 (*)    Potassium 2.5 (*)    Chloride 93 (*)    Glucose, Bld 105 (*)    Calcium 8.6 (*)    All other components within normal limits  CBC - Abnormal; Notable for the following:    WBC 18.2 (*)    All other components within normal limits  TROPONIN I  TROPONIN I   ____________________________________________  EKG  ED ECG REPORT I, Gayla Doss, the attending physician, personally viewed and interpreted this ECG.   Date: 10/27/2015  EKG Time: 01:32  Rate: 91  Rhythm: unchanged from previous tracing on 10/25/15, Normal sinus rhythm  Axis: normal  Intervals:none  ST&T Change: No STEMI. Minimal J-point elevation in the anteroseptal leads. LVH.  ____________________________________________  RADIOLOGY  CXR  IMPRESSION: No acute cardiopulmonary process seen.  ____________________________________________   PROCEDURES  Procedure(s) performed: None  Critical Care performed: No  ____________________________________________   INITIAL IMPRESSION / ASSESSMENT AND PLAN / ED COURSE  Pertinent labs & imaging results that were available during my care of the patient were reviewed by me and considered in my medical decision making (see chart for details).  Glenda Sloan is a 45 y.o. female with no chronic medical problems who presents for evaluation of less than 12 hours of epigastric pain radiating into the chest and towards the back or to began after vomiting and has completely resolved with oxycodone and Zofran in the emergency department. On  exam, she is very well-appearing and in no acute distress. Vital signs are stable, she is afebrile. She has a benign abdominal examination, specifically there is no tenderness to palpation in the epigastrium or the right upper quadrant. EKG is reassuring and unchanged from 10/25/2015. Labs reviewed. CBC shows stable leukocytosis, hyponatremia and hypokalemia when compared to the labs that were drawn on 10/25/2015. No lipase or pregnancy test or AST ALT performed today however just 2 days ago they were unremarkable. I doubt acute cholecystitis or clinically significant pancreatitis. Troponin negative 2. I doubt that this represents ACS. PERC negative and I doubt PE. Clinical picture not consistent with acute aortic dissection. Chest x-ray shows no acute cardio pulmonary process. Suspect GI source of pain, possibly gastritis, esophageal spasm. I have ordered potassium for repletion. She is tolerating by mouth intake. We'll discharge with Zofran. We discussed return precautions, need for close PCP follow-up and she is comfortable with the discharge plan. DC home. ____________________________________________   FINAL CLINICAL IMPRESSION(S) / ED DIAGNOSES  Final diagnoses:  Acute epigastric pain  Non-intractable vomiting without nausea, unspecified vomiting type      Bobetta Lime  Lavena StanfordA Laci Frenkel, MD 10/27/15 [phone]

## 2016-10-03 ENCOUNTER — Encounter: Payer: Self-pay | Admitting: Emergency Medicine

## 2016-10-03 ENCOUNTER — Emergency Department
Admission: EM | Admit: 2016-10-03 | Discharge: 2016-10-03 | Disposition: A | Discharge status: Home / Self Care | Payer: Self-pay | Attending: Emergency Medicine | Admitting: Emergency Medicine

## 2016-10-03 DIAGNOSIS — F17200 Nicotine dependence, unspecified, uncomplicated: Secondary | ICD-10-CM | POA: Insufficient documentation

## 2016-10-03 DIAGNOSIS — B349 Viral infection, unspecified: Secondary | ICD-10-CM | POA: Insufficient documentation

## 2016-10-03 HISTORY — DX: Diverticulitis of intestine, part unspecified, without perforation or abscess without bleeding: K57.92

## 2016-10-03 LAB — URINALYSIS, COMPLETE (UACMP) WITH MICROSCOPIC
BACTERIA UA: NONE SEEN
BILIRUBIN URINE: NEGATIVE
Glucose, UA: NEGATIVE mg/dL
KETONES UR: NEGATIVE mg/dL
LEUKOCYTES UA: NEGATIVE
NITRITE: NEGATIVE
PROTEIN: NEGATIVE mg/dL
Specific Gravity, Urine: 1.021 (ref 1.005–1.030)
pH: 5 (ref 5.0–8.0)

## 2016-10-03 MED ORDER — PROMETHAZINE HCL 25 MG PO TABS
12.5000 mg | ORAL_TABLET | Freq: Once | ORAL | Status: AC
  Administered 2016-10-03: 12.5 mg via ORAL

## 2016-10-03 MED ORDER — PROMETHAZINE HCL 25 MG PO TABS
ORAL_TABLET | ORAL | Status: AC
  Filled 2016-10-03: qty 1

## 2016-10-03 MED ORDER — IBUPROFEN 600 MG PO TABS
600.0000 mg | ORAL_TABLET | Freq: Once | ORAL | Status: AC
  Administered 2016-10-03: 600 mg via ORAL
  Filled 2016-10-03: qty 1

## 2016-10-03 MED ORDER — ONDANSETRON 4 MG PO TBDP
4.0000 mg | ORAL_TABLET | Freq: Once | ORAL | Status: AC
  Administered 2016-10-03: 4 mg via ORAL
  Filled 2016-10-03: qty 1

## 2016-10-03 MED ORDER — PROMETHAZINE HCL 12.5 MG PO TABS: 12.5000 mg | ORAL_TABLET | Freq: Four times a day (QID) | ORAL | 0 refills | Status: DC | PRN

## 2016-10-03 NOTE — ED Provider Notes (Signed)
[facility] Emergency Department Provider Note  ____________________________________________  Time seen: Approximately 3:59 PM  I have reviewed the triage vital signs and the nursing notes.   HISTORY  Chief Complaint Chills and Nausea   HPI Glenda Sloan is a 46 y.o. female who presents to the emergency department for evaluation of nausea and chills. She does not believe she has had a fever. She states that she has had a poor appetite but has been able to tolerate fluids she has not taken any over-the-counter medications for her symptoms.   Past Medical History:  Diagnosis Date  . Diverticulitis   . Diverticulosis     There are no active problems to display for this patient.   History reviewed. No pertinent surgical history.  Prior to Admission medications   Medication Sig Start Date End Date Taking? Authorizing Provider  acetaminophen (TYLENOL) 325 MG tablet Take 650 mg by mouth every 6 (six) hours as needed.    Historical Provider, MD  metroNIDAZOLE (FLAGYL) 500 MG tablet Take 1 tablet (500 mg total) by mouth 2 (two) times daily after a meal. 10/25/15   Jene Every, MD  ondansetron (ZOFRAN ODT) 4 MG disintegrating tablet Take 1 tablet (4 mg total) by mouth every 8 (eight) hours as needed for nausea or vomiting. 10/27/15   Gayla Doss, MD  promethazine (PHENERGAN) 12.5 MG tablet Take 1 tablet (12.5 mg total) by mouth every 6 (six) hours as needed for nausea or vomiting. 10/03/16   Chinita Pester, FNP    Allergies Patient has no known allergies.  History reviewed. No pertinent family history.  Social History Social History  Substance Use Topics  . Smoking status: Current Every Day Smoker  . Smokeless tobacco: Not on file  . Alcohol use Yes    Review of Systems Constitutional: Negative for fever/chills ENT: Negative for sore throat. Cardiovascular: Denies chest pain. Respiratory: No shortness of breath. Negative for cough. Gastrointestinal:  Positive for nausea,  negative for vomiting.  Negative for diarrhea.  Musculoskeletal: Positive for body aches Skin: Negative for rash. Neurological: Negative for headaches ____________________________________________   PHYSICAL EXAM:  VITAL SIGNS: ED Triage Vitals  Enc Vitals Group     BP 10/03/16 1449 136/90     Pulse Rate 10/03/16 1449 70     Resp 10/03/16 1449 18     Temp 10/03/16 1449 98.9 F (37.2 C)     Temp Source 10/03/16 1449 Oral     SpO2 10/03/16 1449 99 %     Weight 10/03/16 1448 190 lb (86.2 kg)     Height 10/03/16 1448 5\' 5"  (1.651 m)     Head Circumference --      Peak Flow --      Pain Score --      Pain Loc --      Pain Edu? --      Excl. in GC? --     Constitutional: Alert and oriented. Acutely ill appearing and in no acute distress. Eyes: Conjunctivae are normal. EOMI. Ears: Bilateral tympanic membranes are normal. Nose: No congestion noted; no rhinnorhea. Mouth/Throat: Mucous membranes are moist.  Oropharynx nonerythematous. Tonsils 1+ without exudate. Neck: No stridor.  Lymphatic: No cervical lymphadenopathy. Cardiovascular: Normal rate, regular rhythm. Good peripheral circulation. Respiratory: Normal respiratory effort.  No retractions. Sounds clear to auscultation throughout.. Gastrointestinal: Soft and nontender.  Musculoskeletal: FROM x 4 extremities.  Neurologic:  Normal speech and language.  Skin:  Skin is warm, dry and intact. No  rash noted. Psychiatric: Mood and affect are normal. Speech and behavior are normal.  ____________________________________________   LABS (all labs ordered are listed, but only abnormal results are displayed)  Labs Reviewed  URINALYSIS, COMPLETE (UACMP) WITH MICROSCOPIC - Abnormal; Notable for the following:       Result Value   Color, Urine YELLOW (*)    APPearance CLEAR (*)    Hgb urine dipstick SMALL (*)    Squamous Epithelial / LPF 6-30 (*)    All other components within normal limits    ____________________________________________  EKG  Not indicated ____________________________________________  RADIOLOGY  Not indicated ____________________________________________   PROCEDURES  Procedure(s) performed: None  Critical Care performed: No ____________________________________________   INITIAL IMPRESSION / ASSESSMENT AND PLAN / ED COURSE  46 year old female presenting to the emergency department for evaluation of nausea and decreased appetite. While in the emergency department today, she was given Zofran ODT and ibuprofen. She did not report any further vomiting and was able to tolerate a by mouth challenge. She will be discharged home with a prescription for Phenergan and advised to return to the emergency department for symptoms that change or worsen if she is unable schedule an appointment with the primary care provider for choice.  Pertinent labs & imaging results that were available during my care of the patient were reviewed by me and considered in my medical decision making (see chart for details).  Discharge Medication List as of 10/03/2016  6:41 PM    START taking these medications   Details  promethazine (PHENERGAN) 12.5 MG tablet Take 1 tablet (12.5 mg total) by mouth every 6 (six) hours as needed for nausea or vomiting., Starting Sun 10/03/2016, Print        If controlled substance prescribed during this visit, 12 month history viewed on the [facility] prior to issuing an initial prescription for Schedule II or III opiod. ____________________________________________   FINAL CLINICAL IMPRESSION(S) / ED DIAGNOSES  Final diagnoses:  Acute viral syndrome    Note:  This document was prepared using Dragon voice recognition software and may include unintentional dictation errors.     Chinita PesterCari B Laykin Rainone, FNP 10/03/16 2102    Jennye MoccasinBrian S Quigley, MD 10/03/16 2123

## 2016-10-03 NOTE — Discharge Instructions (Signed)
Take the phenergan as prescribed if needed for nausea. Take ibuprofen or tylenol for fever/bodyaches. Follow up with the primary care provider of your choice for symptoms that are not improving over the next few days. Return to the ER for symptoms that change or worsen if unable to schedule an appointment.

## 2016-10-03 NOTE — ED Triage Notes (Signed)
Pt presents to ED via POV with c/o nausea and chills, unknown if had any fever at this time. Pt is alert and oriented, NAD noted, respirations even and unlabored, skin warm, dry, and intact.

## 2017-06-16 ENCOUNTER — Encounter: Payer: Self-pay | Admitting: Emergency Medicine

## 2017-06-16 ENCOUNTER — Emergency Department
Admission: EM | Admit: 2017-06-16 | Discharge: 2017-06-16 | Disposition: A | Discharge status: Home / Self Care | Payer: Self-pay | Attending: Emergency Medicine | Admitting: Emergency Medicine

## 2017-06-16 DIAGNOSIS — Z79899 Other long term (current) drug therapy: Secondary | ICD-10-CM | POA: Insufficient documentation

## 2017-06-16 DIAGNOSIS — R112 Nausea with vomiting, unspecified: Secondary | ICD-10-CM | POA: Insufficient documentation

## 2017-06-16 DIAGNOSIS — N390 Urinary tract infection, site not specified: Secondary | ICD-10-CM | POA: Insufficient documentation

## 2017-06-16 DIAGNOSIS — F17210 Nicotine dependence, cigarettes, uncomplicated: Secondary | ICD-10-CM | POA: Insufficient documentation

## 2017-06-16 LAB — URINALYSIS, COMPLETE (UACMP) WITH MICROSCOPIC
BACTERIA UA: NONE SEEN
Bilirubin Urine: NEGATIVE
Glucose, UA: NEGATIVE mg/dL
Ketones, ur: 5 mg/dL — AB
NITRITE: NEGATIVE
PH: 6 (ref 5.0–8.0)
Protein, ur: NEGATIVE mg/dL
SPECIFIC GRAVITY, URINE: 1.01 (ref 1.005–1.030)

## 2017-06-16 LAB — COMPREHENSIVE METABOLIC PANEL
ALBUMIN: 4 g/dL (ref 3.5–5.0)
ALT: 8 U/L — ABNORMAL LOW (ref 14–54)
AST: 15 U/L (ref 15–41)
Alkaline Phosphatase: 82 U/L (ref 38–126)
Anion gap: 9 (ref 5–15)
BILIRUBIN TOTAL: 0.9 mg/dL (ref 0.3–1.2)
BUN: 12 mg/dL (ref 6–20)
CO2: 30 mmol/L (ref 22–32)
Calcium: 9.1 mg/dL (ref 8.9–10.3)
Chloride: 93 mmol/L — ABNORMAL LOW (ref 101–111)
Creatinine, Ser: 0.86 mg/dL (ref 0.44–1.00)
GFR calc Af Amer: >60 mL/min (ref >60)
GFR calc non Af Amer: >60 mL/min (ref >60)
GLUCOSE: 112 mg/dL — AB (ref 65–99)
POTASSIUM: 2.9 mmol/L — AB (ref 3.5–5.1)
Sodium: 132 mmol/L — ABNORMAL LOW (ref 135–145)
TOTAL PROTEIN: 8.3 g/dL — AB (ref 6.5–8.1)

## 2017-06-16 LAB — CBC
HEMATOCRIT: 49.9 % — AB (ref 35.0–47.0)
Hemoglobin: 17 g/dL — ABNORMAL HIGH (ref 12.0–16.0)
MCH: 33.5 pg (ref 26.0–34.0)
MCHC: 34.1 g/dL (ref 32.0–36.0)
MCV: 98.1 fL (ref 80.0–100.0)
Platelets: 281 10*3/uL (ref 150–440)
RBC: 5.08 MIL/uL (ref 3.80–5.20)
RDW: 12.9 % (ref 11.5–14.5)
WBC: 14.3 10*3/uL — ABNORMAL HIGH (ref 3.6–11.0)

## 2017-06-16 LAB — LIPASE, BLOOD: Lipase: 27 U/L (ref 11–51)

## 2017-06-16 LAB — POCT PREGNANCY, URINE: Preg Test, Ur: NEGATIVE

## 2017-06-16 MED ORDER — NITROFURANTOIN MONOHYD MACRO 100 MG PO CAPS: 100.0000 mg | ORAL_CAPSULE | Freq: Two times a day (BID) | ORAL | 0 refills | Status: AC

## 2017-06-16 MED ORDER — SODIUM CHLORIDE 0.9 % IV BOLUS (SEPSIS)
1000.0000 mL | Freq: Once | INTRAVENOUS | Status: AC
Start: 2017-06-16 — End: 2017-06-16
  Administered 2017-06-16: 1000 mL via INTRAVENOUS

## 2017-06-16 MED ORDER — ONDANSETRON HCL 4 MG/2ML IJ SOLN
4.0000 mg | Freq: Once | INTRAMUSCULAR | Status: AC | PRN
  Administered 2017-06-16: 4 mg via INTRAVENOUS
  Filled 2017-06-16: qty 2

## 2017-06-16 MED ORDER — ONDANSETRON HCL 4 MG PO TABS: 4.0000 mg | ORAL_TABLET | Freq: Three times a day (TID) | ORAL | 0 refills | Status: DC | PRN

## 2017-06-16 MED ORDER — POTASSIUM CHLORIDE CRYS ER 20 MEQ PO TBCR
40.0000 meq | EXTENDED_RELEASE_TABLET | Freq: Once | ORAL | Status: AC
  Administered 2017-06-16: 40 meq via ORAL
  Filled 2017-06-16: qty 2

## 2017-06-16 NOTE — Discharge Instructions (Signed)
Please seek medical attention for any high fevers, chest pain, shortness of breath, change in behavior, persistent vomiting, bloody stool or any other new or concerning symptoms.  

## 2017-06-16 NOTE — ED Triage Notes (Signed)
Pt to ed with c/o abd pain, n/v since Sunday.

## 2017-06-16 NOTE — ED Provider Notes (Signed)
[facility] Emergency Department Provider Note   ____________________________________________   I have reviewed the triage vital signs and the nursing notes.   HISTORY  Chief Complaint Nausea and Emesis   History limited by: Not Limited   HPI Glenda Sloan is a 46 y.o. female who presents to the emergency department today because of concern for nausea, vomiting and abdominal pain.   LOCATION:generalized abdominal pain DURATION:4 days TIMING: fairly constant, slightly better 2 days ago SEVERITY: severe CONTEXT: patient states that she has these symptoms often. Denies any unusual ingestion. Denies any known sick contacts. Does have a history of diverticulitis however states this feels different. MODIFYING FACTORS: tried alka seltzer without any relief ASSOCIATED SYMPTOMS: denies any fevers  Per medical record review patient has a history of diverticulitis.   Past Medical History:  Diagnosis Date  . Diverticulitis   . Diverticulosis     There are no active problems to display for this patient.   History reviewed. No pertinent surgical history.  Prior to Admission medications   Medication Sig Start Date End Date Taking? Authorizing Provider  acetaminophen (TYLENOL) 325 MG tablet Take 650 mg by mouth every 6 (six) hours as needed.    [provider]  metroNIDAZOLE (FLAGYL) 500 MG tablet Take 1 tablet (500 mg total) by mouth 2 (two) times daily after a meal. 10/25/15   Jene EveryKinner, Robert, MD  ondansetron (ZOFRAN ODT) 4 MG disintegrating tablet Take 1 tablet (4 mg total) by mouth every 8 (eight) hours as needed for nausea or vomiting. 10/27/15   Gayla DossGayle, Eryka A, MD  promethazine (PHENERGAN) 12.5 MG tablet Take 1 tablet (12.5 mg total) by mouth every 6 (six) hours as needed for nausea or vomiting. 10/03/16   Kem Boroughsriplett, Cari B, FNP    Allergies Patient has no known allergies.  History reviewed. No pertinent family history.  Social History Social  History   Tobacco Use  . Smoking status: Current Every Day Smoker  . Smokeless tobacco: Never Used  Substance Use Topics  . Alcohol use: Yes  . Drug use: Yes    Types: Marijuana    Review of Systems Constitutional: No fever/chills Eyes: No visual changes. ENT: No sore throat. Cardiovascular: Denies chest pain. Respiratory: Denies shortness of breath. Gastrointestinal: Positive for abdominal pain, nausea and vomiting.  Genitourinary: Negative for dysuria. Musculoskeletal: Negative for back pain. Skin: Negative for rash. Neurological: Negative for headaches, focal weakness or numbness.  ____________________________________________   PHYSICAL EXAM:  VITAL SIGNS: ED Triage Vitals  Enc Vitals Group     BP 06/16/17 0910 (!) 113/48     Pulse Rate 06/16/17 0910 81     Resp 06/16/17 0910 16     Temp 06/16/17 0910 98.1 F (36.7 C)     Temp Source 06/16/17 0910 Oral     SpO2 06/16/17 0910 98 %     Weight 06/16/17 0911 190 lb (86.2 kg)     Height --      Head Circumference --      Peak Flow --      Pain Score 06/16/17 0910 8   Constitutional: Alert and oriented. Well appearing and in no distress. Eyes: Conjunctivae are normal.  ENT   Head: Normocephalic and atraumatic.   Nose: No congestion/rhinnorhea.   Mouth/Throat: Mucous membranes are moist.   Neck: No stridor. Hematological/Lymphatic/Immunilogical: No cervical lymphadenopathy. Cardiovascular: Normal rate, regular rhythm.  No murmurs, rubs, or gallops.  Respiratory: Normal respiratory effort without tachypnea nor retractions. Breath sounds  are clear and equal bilaterally. No wheezes/rales/rhonchi. Gastrointestinal: Soft and slightly tender to palpation diffusely. No rebound. No guarding.  Genitourinary: Deferred Musculoskeletal: Normal range of motion in all extremities. No lower extremity edema. Neurologic:  Normal speech and language. No gross focal neurologic deficits are appreciated.  Skin:  Skin is  warm, dry and intact. No rash noted. Psychiatric: Mood and affect are normal. Speech and behavior are normal. Patient exhibits appropriate insight and judgment.  ____________________________________________    LABS (pertinent positives/negatives)  Upreg neg UA 6-30 wbcs, trace leukocytes CMP na 132, k 2.9, cr 0.86 CBC wbc 14.3, hgb 17.0  ____________________________________________   EKG  None  ____________________________________________    RADIOLOGY  None  ____________________________________________   PROCEDURES  Procedures  ____________________________________________   INITIAL IMPRESSION / ASSESSMENT AND PLAN / ED COURSE  Pertinent labs & imaging results that were available during my care of the patient were reviewed by me and considered in my medical decision making (see chart for details).  Differential diagnosis includes, but is not limited to, biliary disease (biliary colic, acute cholecystitis, cholangitis, choledocholithiasis, etc), intrathoracic causes for epigastric abdominal pain including ACS, gastritis, duodenitis, pancreatitis, small bowel or large bowel obstruction, abdominal aortic aneurysm, hernia, and gastritis. Patient did feel significant improvement after iv fluids and medication. At this point think likely gastroenteritis. Additionally urine did have some signs consistent with infection. Discussed findings and plan with patient.  ___________________________________________   FINAL CLINICAL IMPRESSION(S) / ED DIAGNOSES  Final diagnoses:  Nausea and vomiting, intractability of vomiting not specified, unspecified vomiting type  Lower urinary tract infectious disease     Note: This dictation was prepared with Dragon dictation. Any transcriptional errors that result from this process are unintentional     Phineas SemenGoodman, Coralee Edberg, MD 06/16/17 1228

## 2019-05-14 ENCOUNTER — Emergency Department
Admission: EM | Admit: 2019-05-14 | Discharge: 2019-05-14 | Disposition: A | Discharge status: Home / Self Care | Payer: Self-pay | Attending: Emergency Medicine | Admitting: Emergency Medicine

## 2019-05-14 ENCOUNTER — Other Ambulatory Visit: Payer: Self-pay

## 2019-05-14 ENCOUNTER — Encounter: Payer: Self-pay | Admitting: Emergency Medicine

## 2019-05-14 ENCOUNTER — Emergency Department: Payer: Self-pay | Attending: Emergency Medicine

## 2019-05-14 DIAGNOSIS — Z20822 Contact with and (suspected) exposure to covid-19: Secondary | ICD-10-CM

## 2019-05-14 DIAGNOSIS — Y9389 Activity, other specified: Secondary | ICD-10-CM | POA: Insufficient documentation

## 2019-05-14 DIAGNOSIS — Y990 Civilian activity done for income or pay: Secondary | ICD-10-CM | POA: Insufficient documentation

## 2019-05-14 DIAGNOSIS — T148XXA Other injury of unspecified body region, initial encounter: Secondary | ICD-10-CM

## 2019-05-14 DIAGNOSIS — F17200 Nicotine dependence, unspecified, uncomplicated: Secondary | ICD-10-CM | POA: Insufficient documentation

## 2019-05-14 DIAGNOSIS — X501XXA Overexertion from prolonged static or awkward postures, initial encounter: Secondary | ICD-10-CM | POA: Insufficient documentation

## 2019-05-14 DIAGNOSIS — Z20828 Contact with and (suspected) exposure to other viral communicable diseases: Secondary | ICD-10-CM | POA: Insufficient documentation

## 2019-05-14 DIAGNOSIS — S29012A Strain of muscle and tendon of back wall of thorax, initial encounter: Secondary | ICD-10-CM | POA: Insufficient documentation

## 2019-05-14 DIAGNOSIS — Y929 Unspecified place or not applicable: Secondary | ICD-10-CM | POA: Insufficient documentation

## 2019-05-14 DIAGNOSIS — M549 Dorsalgia, unspecified: Secondary | ICD-10-CM

## 2019-05-14 MED ORDER — LIDOCAINE 5 % EX PTCH
1.0000 | MEDICATED_PATCH | CUTANEOUS | Status: DC
  Administered 2019-05-14: 1 via TRANSDERMAL
  Filled 2019-05-14: qty 1

## 2019-05-14 MED ORDER — OXYCODONE-ACETAMINOPHEN 5-325 MG PO TABS
1.0000 | ORAL_TABLET | Freq: Once | ORAL | Status: AC
  Administered 2019-05-14: 15:00:00 1 via ORAL
  Filled 2019-05-14: qty 1

## 2019-05-14 MED ORDER — LIDOCAINE 5 % EX PTCH: 1.0000 | MEDICATED_PATCH | CUTANEOUS | 0 refills | Status: DC

## 2019-05-14 MED ORDER — KETOROLAC TROMETHAMINE 30 MG/ML IJ SOLN
30.0000 mg | Freq: Once | INTRAMUSCULAR | Status: AC
  Administered 2019-05-14: 30 mg via INTRAMUSCULAR
  Filled 2019-05-14: qty 1

## 2019-05-14 MED ORDER — ORPHENADRINE CITRATE 30 MG/ML IJ SOLN
60.0000 mg | Freq: Two times a day (BID) | INTRAMUSCULAR | Status: DC
  Administered 2019-05-14: 60 mg via INTRAMUSCULAR
  Filled 2019-05-14: qty 2

## 2019-05-14 MED ORDER — MELOXICAM 7.5 MG PO TABS: 7.5000 mg | ORAL_TABLET | Freq: Every day | ORAL | 7 refills | Status: AC

## 2019-05-14 MED ORDER — CYCLOBENZAPRINE HCL 5 MG PO TABS: ORAL_TABLET | ORAL | 0 refills | Status: DC

## 2019-05-14 MED ORDER — AZITHROMYCIN 250 MG PO TABS: ORAL_TABLET | ORAL | 0 refills | Status: AC

## 2019-05-14 NOTE — ED Provider Notes (Signed)
[facility] Emergency Department Provider Note  ____________________________________________  Time seen: Approximately 1:50 PM  I have reviewed the triage vital signs and the nursing notes.   HISTORY  Chief Complaint Back Pain    HPI Glenda Sloan is a 48 y.o. female that presents to the emergency department for evaluation of right mid back pain for 2 days.  Pain is worse with certain movements.  Pain does not radiate.  Patient states that back pain started immediately after pushing a laundry basket.  She has been applying icy hot patches without relief.  No additional injuries.  No recent URI.  No fever, nasal congestion, shortness of breath, chest pain, nausea, vomiting, abdominal pain, urinary symptoms.   Past Medical History:  Diagnosis Date  . Diverticulitis   . Diverticulosis     There are no active problems to display for this patient.   History reviewed. No pertinent surgical history.  Prior to Admission medications   Medication Sig Start Date End Date Taking? Authorizing Provider  acetaminophen (TYLENOL) 325 MG tablet Take 650 mg by mouth every 6 (six) hours as needed.    [provider]  azithromycin (ZITHROMAX Z-PAK) 250 MG tablet Take 2 tablets (500 mg) on  Day 1,  followed by 1 tablet (250 mg) once daily on Days 2 through 5. 05/14/19   Laban Emperor, PA-C  cyclobenzaprine (FLEXERIL) 5 MG tablet Take 1-2 tablets 3 times daily as needed 05/14/19   Laban Emperor, PA-C  lidocaine (LIDODERM) 5 % Place 1 patch onto the skin daily. Remove & Discard patch within 12 hours or as directed by MD 05/14/19   Laban Emperor, PA-C  meloxicam (MOBIC) 7.5 MG tablet Take 1 tablet (7.5 mg total) by mouth daily for 7 days. 05/14/19 05/21/19  Laban Emperor, PA-C    Allergies Patient has no known allergies.  No family history on file.  Social History Social History   Tobacco Use  . Smoking status: Current Every Day Smoker  . Smokeless tobacco:  Never Used  Substance Use Topics  . Alcohol use: Yes  . Drug use: Yes    Types: Marijuana     Review of Systems  Constitutional: No fever/chills ENT: No upper respiratory complaints. Cardiovascular: No chest pain. Respiratory: No cough.  No SOB. Gastrointestinal: No abdominal pain.  No nausea, no vomiting.  No urinary symptoms. Musculoskeletal: Positive for back pain. Skin: Negative for rash, abrasions, lacerations, ecchymosis. Neurological: Negative for numbness or tingling   ____________________________________________   PHYSICAL EXAM:  VITAL SIGNS: ED Triage Vitals  Enc Vitals Group     BP 05/14/19 1329 (!) 168/89     Pulse Rate 05/14/19 1329 67     Resp 05/14/19 1329 18     Temp 05/14/19 1329 98.6 F (37 C)     Temp Source 05/14/19 1329 Oral     SpO2 05/14/19 1329 99 %     Weight 05/14/19 1324 183 lb (83 kg)     Height 05/14/19 1324 5\' 5"  (1.651 m)     Head Circumference --      Peak Flow --      Pain Score 05/14/19 1324 10     Pain Loc --      Pain Edu? --      Excl. in [place]? --      Constitutional: Alert and oriented. Well appearing and in no acute distress. Eyes: Conjunctivae are normal. PERRL. EOMI. Head: Atraumatic. ENT:      Ears:  Nose: No congestion/rhinnorhea.      Mouth/Throat: Mucous membranes are moist.  Neck: No stridor.   Cardiovascular: Normal rate, regular rhythm.  Good peripheral circulation. Respiratory: Normal respiratory effort without tachypnea or retractions. Lungs CTAB. Good air entry to the bases with no decreased or absent breath sounds. Gastrointestinal: Bowel sounds 4 quadrants. Soft and nontender to palpation. No guarding or rigidity. No palpable masses. No distention. No CVA tenderness. Musculoskeletal: Full range of motion to all extremities. No gross deformities appreciated.  Tenderness to palpation to right mid back.  Pain elicited with range of motion of thoracic spine.  No tenderness to palpation to thoracic or lumbar  spine. Neurologic:  Normal speech and language. No gross focal neurologic deficits are appreciated.  Skin:  Skin is warm, dry and intact. No rash noted. Psychiatric: Mood and affect are normal. Speech and behavior are normal. Patient exhibits appropriate insight and judgement.   ____________________________________________   LABS (all labs ordered are listed, but only abnormal results are displayed)  Labs Reviewed  NOVEL CORONAVIRUS, NAA (HOSP ORDER, SEND-OUT TO REF LAB; TAT 18-24 HRS)   ____________________________________________  EKG   ____________________________________________  RADIOLOGY Lexine BatonI, Alexsa Flaum, personally viewed and evaluated these images (plain radiographs) as part of my medical decision making, as well as reviewing the written report by the radiologist.  Dg Ribs Unilateral W/chest Right  Result Date: 05/14/2019 CLINICAL DATA:  Right lower rib pain secondary to an injury 2 days ago. EXAM: RIGHT RIBS AND CHEST - 3+ VIEW COMPARISON:  Chest x-ray dated 10/27/2015 FINDINGS: There is no acute rib fracture. There is an old deformity of the anterolateral aspect of the right sixth rib consistent with remote fracture, unchanged since 2017. However, there are faint infiltrates at both lung bases, right more than left. No effusions. Heart size and vascularity are normal. IMPRESSION: 1. No acute rib fracture. 2. Faint infiltrates at both lung bases, right more than left. This could represent pneumonia. 3. Old fracture of the right sixth rib. Electronically Signed   By: Francene BoyersJames  Maxwell M.D.   On: 05/14/2019 14:33   Dg Thoracic Spine 2 View  Result Date: 05/14/2019 CLINICAL DATA:  Back pain secondary to an injury pushing a cart 2 days ago. EXAM: THORACIC SPINE 2 VIEWS COMPARISON:  Chest x-ray dated 10/27/2015 FINDINGS: There is no evidence of thoracic spine fracture. Alignment is normal. No other significant bone abnormalities are identified. IMPRESSION: Negative. Electronically  Signed   By: Francene BoyersJames  Maxwell M.D.   On: 05/14/2019 14:30    ____________________________________________    PROCEDURES  Procedure(s) performed:    Procedures    Medications  lidocaine (LIDODERM) 5 % 1 patch (1 patch Transdermal Patch Applied 05/14/19 1440)  orphenadrine (NORFLEX) injection 60 mg (60 mg Intramuscular Given 05/14/19 1440)  ketorolac (TORADOL) 30 MG/ML injection 30 mg (30 mg Intramuscular Given 05/14/19 1440)  oxyCODONE-acetaminophen (PERCOCET/ROXICET) 5-325 MG per tablet 1 tablet (1 tablet Oral Given 05/14/19 1444)     ____________________________________________   INITIAL IMPRESSION / ASSESSMENT AND PLAN / ED COURSE  Pertinent labs & imaging results that were available during my care of the patient were reviewed by me and considered in my medical decision making (see chart for details).  Review of the [place] CSRS was performed in accordance of the [facility] prior to dispensing any controlled drugs.     Patient presented to the emergency department for evaluation of mid back pain.  Vital signs and exam are reassuring.  Pain completely resolved after Percocet, Toradol, Norflex.  X-ray negative for acute bony abnormalities.  Chest x-ray shows faint bilateral infiltrates.  Patient denies any URI symptoms.  Patient works at [employer] and states that there is a current Covid outbreak there.  Covid test was ordered.  Patient will be covered for bacterial infection with azithromycin while pending Covid test.  Patient would like to go home and declines waiting any further for a urinalysis.  Low suspicion for pyelonephritis or urinary infection.  Patient will be discharged home with prescriptions for azithromycin, Flexeril, Lidoderm, Mobic. Patient is to follow up with primary care as directed. Patient is given ED precautions to return to the ED for any worsening or new symptoms.     ____________________________________________  FINAL CLINICAL IMPRESSION(S) / ED  DIAGNOSES  Final diagnoses:  Mid back pain  Muscle strain  Encounter for screening laboratory testing for COVID-19 virus      NEW MEDICATIONS STARTED DURING THIS VISIT:  ED Discharge Orders         Ordered    azithromycin (ZITHROMAX Z-PAK) 250 MG tablet     05/14/19 1503    cyclobenzaprine (FLEXERIL) 5 MG tablet     05/14/19 1503    lidocaine (LIDODERM) 5 %  Every 24 hours     05/14/19 1503    meloxicam (MOBIC) 7.5 MG tablet  Daily     05/14/19 1503              This chart was dictated using voice recognition software/Dragon. Despite best efforts to proofread, errors can occur which can change the meaning. Any change was purely unintentional.    Enid Derry, PA-C 05/14/19 1524    Emily Filbert, MD 05/14/19 1606

## 2019-05-14 NOTE — ED Triage Notes (Signed)
Pt reports injured back at work. Pt states works in [employer] and was pushing a cart 2 days ago and hurt her back.  Pt tearful I triage and states this is a WC case.

## 2019-05-14 NOTE — Discharge Instructions (Signed)
Your chest x-ray looks like you may have COVID-19.  Please begin azithromycin in case your Covid test is negative.  Your Covid results should be ready in about 2 days.  Please isolate yourself until Covid results.  You can take Mobic and Flexeril for your back pain.

## 2019-05-14 NOTE — ED Notes (Signed)
See triage note  States she was pulling linen 2 days ago at work  Felt pain to right mid back

## 2019-05-14 NOTE — ED Notes (Addendum)
Called HR per WC profile request, Spoke with Alyse Low who made several attempts to contact management for required testing. Unable to speak with anyone.

## 2019-05-15 LAB — NOVEL CORONAVIRUS, NAA (HOSP ORDER, SEND-OUT TO REF LAB; TAT 18-24 HRS): SARS-CoV-2, NAA: NOT DETECTED

## 2019-05-16 ENCOUNTER — Telehealth: Payer: Self-pay | Admitting: General Practice

## 2019-05-16 NOTE — Telephone Encounter (Signed)
Pt called in for covid results   Advised of NOT DETECTED result.

## 2020-01-11 ENCOUNTER — Emergency Department: Payer: Self-pay

## 2020-01-11 ENCOUNTER — Other Ambulatory Visit: Payer: Self-pay

## 2020-01-11 ENCOUNTER — Encounter: Payer: Self-pay | Admitting: Emergency Medicine

## 2020-01-11 ENCOUNTER — Emergency Department
Admission: EM | Admit: 2020-01-11 | Discharge: 2020-01-11 | Disposition: A | Discharge status: Home / Self Care | Payer: Self-pay | Attending: Student in an Organized Health Care Education/Training Program | Admitting: Student in an Organized Health Care Education/Training Program

## 2020-01-11 DIAGNOSIS — F17200 Nicotine dependence, unspecified, uncomplicated: Secondary | ICD-10-CM | POA: Insufficient documentation

## 2020-01-11 DIAGNOSIS — R112 Nausea with vomiting, unspecified: Secondary | ICD-10-CM | POA: Insufficient documentation

## 2020-01-11 DIAGNOSIS — R1013 Epigastric pain: Secondary | ICD-10-CM | POA: Insufficient documentation

## 2020-01-11 DIAGNOSIS — F1210 Cannabis abuse, uncomplicated: Secondary | ICD-10-CM | POA: Insufficient documentation

## 2020-01-11 LAB — COMPREHENSIVE METABOLIC PANEL
ALT: 14 U/L (ref 0–44)
AST: 14 U/L — ABNORMAL LOW (ref 15–41)
Albumin: 4 g/dL (ref 3.5–5.0)
Alkaline Phosphatase: 80 U/L (ref 38–126)
Anion gap: 11 (ref 5–15)
BUN: 18 mg/dL (ref 6–20)
CO2: 29 mmol/L (ref 22–32)
Calcium: 9.3 mg/dL (ref 8.9–10.3)
Chloride: 95 mmol/L — ABNORMAL LOW (ref 98–111)
Creatinine, Ser: 0.87 mg/dL (ref 0.44–1.00)
GFR calc Af Amer: >60 mL/min (ref >60)
GFR calc non Af Amer: >60 mL/min (ref >60)
Glucose, Bld: 112 mg/dL — ABNORMAL HIGH (ref 70–99)
Potassium: 3.1 mmol/L — ABNORMAL LOW (ref 3.5–5.1)
Sodium: 135 mmol/L (ref 135–145)
Total Bilirubin: 1 mg/dL (ref 0.3–1.2)
Total Protein: 8.6 g/dL — ABNORMAL HIGH (ref 6.5–8.1)

## 2020-01-11 LAB — CBC
HCT: 43.7 % (ref 36.0–46.0)
Hemoglobin: 15.3 g/dL — ABNORMAL HIGH (ref 12.0–15.0)
MCH: 33 pg (ref 26.0–34.0)
MCHC: 35 g/dL (ref 30.0–36.0)
MCV: 94.4 fL (ref 80.0–100.0)
Platelets: 317 10*3/uL (ref 150–400)
RBC: 4.63 MIL/uL (ref 3.87–5.11)
RDW: 11.8 % (ref 11.5–15.5)
WBC: 12.1 10*3/uL — ABNORMAL HIGH (ref 4.0–10.5)
nRBC: 0 % (ref 0.0–0.2)

## 2020-01-11 LAB — TROPONIN I (HIGH SENSITIVITY)
Troponin I (High Sensitivity): 5 ng/L (ref <18)
Troponin I (High Sensitivity): 7 ng/L (ref <18)

## 2020-01-11 LAB — LIPASE, BLOOD: Lipase: 30 U/L (ref 11–51)

## 2020-01-11 LAB — URINALYSIS, COMPLETE (UACMP) WITH MICROSCOPIC
Bilirubin Urine: NEGATIVE
Glucose, UA: NEGATIVE mg/dL
Ketones, ur: 5 mg/dL — AB
Leukocytes,Ua: NEGATIVE
Nitrite: NEGATIVE
Protein, ur: NEGATIVE mg/dL
Specific Gravity, Urine: 1.028 (ref 1.005–1.030)
pH: 6 (ref 5.0–8.0)

## 2020-01-11 MED ORDER — SODIUM CHLORIDE 0.9% FLUSH: 3.0000 mL | Freq: Once | INTRAVENOUS | Status: DC

## 2020-01-11 MED ORDER — IOHEXOL 300 MG/ML  SOLN
100.0000 mL | Freq: Once | INTRAMUSCULAR | Status: AC | PRN
  Administered 2020-01-11: 100 mL via INTRAVENOUS

## 2020-01-11 MED ORDER — MORPHINE SULFATE (PF) 4 MG/ML IV SOLN
4.0000 mg | INTRAVENOUS | Status: DC | PRN
  Filled 2020-01-11: qty 1

## 2020-01-11 MED ORDER — POTASSIUM CHLORIDE CRYS ER 20 MEQ PO TBCR
40.0000 meq | EXTENDED_RELEASE_TABLET | Freq: Once | ORAL | Status: AC
  Administered 2020-01-11: 40 meq via ORAL
  Filled 2020-01-11: qty 2

## 2020-01-11 MED ORDER — SODIUM CHLORIDE 0.9 % IV BOLUS
1000.0000 mL | Freq: Once | INTRAVENOUS | Status: AC
  Administered 2020-01-11: 1000 mL via INTRAVENOUS

## 2020-01-11 MED ORDER — ONDANSETRON HCL 4 MG PO TABS: 4.0000 mg | ORAL_TABLET | Freq: Every day | ORAL | 0 refills | Status: AC | PRN

## 2020-01-11 MED ORDER — ACETAMINOPHEN 500 MG PO TABS
1000.0000 mg | ORAL_TABLET | Freq: Once | ORAL | Status: DC
  Administered 2020-01-11: 1000 mg via ORAL
  Filled 2020-01-11: qty 2

## 2020-01-11 MED ORDER — ONDANSETRON HCL 4 MG/2ML IJ SOLN
4.0000 mg | Freq: Once | INTRAMUSCULAR | Status: AC
  Administered 2020-01-11: 4 mg via INTRAVENOUS
  Filled 2020-01-11: qty 2

## 2020-01-11 NOTE — ED Notes (Signed)
See triage note. Pt reports vomiting since Wednesday along with mid abdominal pain. Denies CP, reports SHOB with "vomiting episodes".  Pt states "everything I throw up is hot, like it has been boiling on a stove, I have a fever on my stomach".  States vomit has been clear and foamy, yellowish, then looked like coffee grounds this morning.

## 2020-01-11 NOTE — ED Notes (Signed)
Lab contacted to add on first troponin to previous blood work. Lab contacted to come collect 2nd troponin d/t pt hard stick

## 2020-01-11 NOTE — ED Notes (Signed)
Pt given sandwich tray and ice water, ok per Dr Roxan Hockey

## 2020-01-11 NOTE — ED Triage Notes (Signed)
Pt to ED [facility] from home for abdominal pain and vomiting. Pt states that symptoms first started on Monday. This morning she was vomiting what looked like coffee grounds. Pt states that she had 3 episodes of coffee ground emesis. Pt has not been able to eat since Tuesday. Pt is able to keep some liquids down. Pt is currently in NAD.

## 2020-01-11 NOTE — ED Provider Notes (Signed)
[facility] Emergency Department Provider Note    First MD Initiated Contact with Patient 01/11/20 1806     (approximate)  I have reviewed the triage vital signs and the nursing notes.   HISTORY  Chief Complaint Emesis and Abdominal Pain    HPI Glenda Sloan is a 49 y.o. female the below listed past medical history presents to the ER for evaluation of 4 days of nausea vomiting abdominal pain.  States she is having decreased bowel movements and now passing gas.  States she is also had a bowel obstruction in the past.  Not been on any recent antibiotics.  States she is having "frothy "emesis.  No hematochezia.  No melena.  No hematemesis or coffee-ground emesis.      Past Medical History:  Diagnosis Date  . Diverticulitis   . Diverticulosis    No family history on file. History reviewed. No pertinent surgical history. There are no problems to display for this patient.     Prior to Admission medications   Medication Sig Start Date End Date Taking? Authorizing Provider  acetaminophen (TYLENOL) 325 MG tablet Take 650 mg by mouth every 6 (six) hours as needed.    [provider]  azithromycin (ZITHROMAX Z-PAK) 250 MG tablet Take 2 tablets (500 mg) on  Day 1,  followed by 1 tablet (250 mg) once daily on Days 2 through 5. 05/14/19   Enid Derry, PA-C  cyclobenzaprine (FLEXERIL) 5 MG tablet Take 1-2 tablets 3 times daily as needed 05/14/19   Enid Derry, PA-C  lidocaine (LIDODERM) 5 % Place 1 patch onto the skin daily. Remove & Discard patch within 12 hours or as directed by MD 05/14/19   Enid Derry, PA-C    Allergies Patient has no known allergies.    Social History Social History   Tobacco Use  . Smoking status: Current Every Day Smoker  . Smokeless tobacco: Never Used  Substance Use Topics  . Alcohol use: Yes  . Drug use: Yes    Types: Marijuana    Review of Systems Patient denies headaches, rhinorrhea, blurry vision,  numbness, shortness of breath, chest pain, edema, cough, abdominal pain, nausea, vomiting, diarrhea, dysuria, fevers, rashes or hallucinations unless otherwise stated above in HPI. ____________________________________________   PHYSICAL EXAM:  VITAL SIGNS: Vitals:   01/11/20 1830 01/11/20 2021  BP: (!) 145/85   Pulse: 69   Resp:    Temp:  98.7 F (37.1 C)  SpO2: 99%     Constitutional: Alert and oriented.  Eyes: Conjunctivae are normal.  Head: Atraumatic. Nose: No congestion/rhinnorhea. Mouth/Throat: Mucous membranes are moist.   Neck: No stridor. Painless ROM.  Cardiovascular: Normal rate, regular rhythm. Grossly normal heart sounds.  Good peripheral circulation. Respiratory: Normal respiratory effort.  No retractions. Lungs CTAB. Gastrointestinal: Softwith mild abdominal pain, no rebound or guarding.. No distention. No abdominal bruits. No CVA tenderness. Genitourinary:  Musculoskeletal: No lower extremity tenderness nor edema.  No joint effusions. Neurologic:  Normal speech and language. No gross focal neurologic deficits are appreciated. No facial droop Skin:  Skin is warm, dry and intact. No rash noted. Psychiatric: Mood and affect are normal. Speech and behavior are normal.  ____________________________________________   LABS (all labs ordered are listed, but only abnormal results are displayed)  Results for orders placed or performed during the hospital encounter of 01/11/20 (from the past 24 hour(s))  Lipase, blood     Status: None   Collection Time: 01/11/20  1:05 PM  Result Value Ref Range   Lipase 30 11 - 51 U/L  Comprehensive metabolic panel     Status: Abnormal   Collection Time: 01/11/20  1:05 PM  Result Value Ref Range   Sodium 135 135 - 145 mmol/L   Potassium 3.1 (L) 3.5 - 5.1 mmol/L   Chloride 95 (L) 98 - 111 mmol/L   CO2 29 22 - 32 mmol/L   Glucose, Bld 112 (H) 70 - 99 mg/dL   BUN 18 6 - 20 mg/dL   Creatinine, Ser 3.53 0.44 - 1.00 mg/dL   Calcium  9.3 8.9 - 29.9 mg/dL   Total Protein 8.6 (H) 6.5 - 8.1 g/dL   Albumin 4.0 3.5 - 5.0 g/dL   AST 14 (L) 15 - 41 U/L   ALT 14 0 - 44 U/L   Alkaline Phosphatase 80 38 - 126 U/L   Total Bilirubin 1.0 0.3 - 1.2 mg/dL   GFR calc non Af Amer >60 >60 mL/min   GFR calc Af Amer >60 >60 mL/min   Anion gap 11 5 - 15  CBC     Status: Abnormal   Collection Time: 01/11/20  1:05 PM  Result Value Ref Range   WBC 12.1 (H) 4.0 - 10.5 K/uL   RBC 4.63 3.87 - 5.11 MIL/uL   Hemoglobin 15.3 (H) 12.0 - 15.0 g/dL   HCT 24.2 36 - 46 %   MCV 94.4 80.0 - 100.0 fL   MCH 33.0 26.0 - 34.0 pg   MCHC 35.0 30.0 - 36.0 g/dL   RDW 68.3 41.9 - 62.2 %   Platelets 317 150 - 400 K/uL   nRBC 0.0 0.0 - 0.2 %  Urinalysis, Complete w Microscopic     Status: Abnormal   Collection Time: 01/11/20  1:05 PM  Result Value Ref Range   Color, Urine YELLOW (A) YELLOW   APPearance HAZY (A) CLEAR   Specific Gravity, Urine 1.028 1.005 - 1.030   pH 6.0 5.0 - 8.0   Glucose, UA NEGATIVE NEGATIVE mg/dL   Hgb urine dipstick SMALL (A) NEGATIVE   Bilirubin Urine NEGATIVE NEGATIVE   Ketones, ur 5 (A) NEGATIVE mg/dL   Protein, ur NEGATIVE NEGATIVE mg/dL   Nitrite NEGATIVE NEGATIVE   Leukocytes,Ua NEGATIVE NEGATIVE   RBC / HPF 0-5 0 - 5 RBC/hpf   WBC, UA 0-5 0 - 5 WBC/hpf   Bacteria, UA RARE (A) NONE SEEN   Squamous Epithelial / LPF 6-10 0 - 5   Mucus PRESENT    ____________________________________________  ED ECG REPORT I, Willy Eddy, the attending physician, personally viewed and interpreted this ECG.   Date: 01/11/2020  EKG Time: 21:01  Rate: 75  Rhythm: sinus  Axis: normal  Intervals: normal intervals,  ST&T Change: nonspecific t wave abnormalities, new friom previous  ____________________________________________  RADIOLOGY  I personally reviewed all radiographic images ordered to evaluate for the above acute complaints and reviewed radiology reports and findings.  These findings were personally discussed with the  patient.  Please see medical record for radiology report.  ____________________________________________   PROCEDURES  Procedure(s) performed:  Procedures    Critical Care performed: no ____________________________________________   INITIAL IMPRESSION / ASSESSMENT AND PLAN / ED COURSE  Pertinent labs & imaging results that were available during my care of the patient were reviewed by me and considered in my medical decision making (see chart for details).   DDX: Gastritis, enteritis, SBO, diverticulitis, mass, pancreatitis, biliary pathology  Glenda Sloan is a 49 y.o.  who presents to the ED with symptoms as described above.  Patient complaining of epigastric pain nausea and vomiting since Tuesday.  Will give IV fluids as well as IV antiemetics IV pain medication.  Given her history will order CT imaging.  The patient will be placed on continuous pulse oximetry and telemetry for monitoring.  Laboratory evaluation will be sent to evaluate for the above complaints.     Clinical Course as of Jan 11 2108  Fri Jan 11, 2020  2017 Repeat abdominal exam soft and benign.  Imaging is reassuring.  States that she feels significantly improved.  Will p.o. challenge and reassess.  If able to tolerate p.o. I do believe she stable and appropriate for outpatient follow-up.   [PR]  2106 On review of patient's EKG she does have some lateral T wave changes that are new compared to previous.  She adamantly denies any sort of pressure or pain but given her age and risk factors will out on enzymes to ensure there is no sign of myocardial injury.  She is completely asymptomatic at this time.  If enzymes are negative I think that she is appropriate for outpatient follow-up with PCP and referral to cardiology.   [PR]    Clinical Course User Index [PR] Merlyn Lot, MD    The patient was evaluated in Emergency Department today for the symptoms described in the history of present illness. He/she was  evaluated in the context of the global COVID-19 pandemic, which necessitated consideration that the patient might be at risk for infection with the SARS-CoV-2 virus that causes COVID-19. Institutional protocols and algorithms that pertain to the evaluation of patients at risk for COVID-19 are in a state of rapid change based on information released by regulatory bodies including the CDC and federal and state organizations. These policies and algorithms were followed during the patient's care in the ED.  As part of my medical decision making, I reviewed the following data within the [place] notes reviewed and incorporated, Labs reviewed, notes from prior ED visits and [place] Controlled Substance Database   ____________________________________________   FINAL CLINICAL IMPRESSION(S) / ED DIAGNOSES  Final diagnoses:  Non-intractable vomiting with nausea, unspecified vomiting type  Epigastric pain      NEW MEDICATIONS STARTED DURING THIS VISIT:  New Prescriptions   No medications on file     Note:  This document was prepared using Dragon voice recognition software and may include unintentional dictation errors.    Merlyn Lot, MD 01/11/20 2109

## 2020-01-11 NOTE — ED Provider Notes (Signed)
Patient handed off pending repeat cardiac markers. Cardiac markers negative x2. Patient feels comfortable with being discharged home at this time   Concha Se, MD 01/11/20 2246

## 2020-01-11 NOTE — ED Notes (Signed)
Reviewed discharge instructions, follow-up care, and prescriptions with patient. Patient verbalized understanding of all information reviewed. Patient stable, with no distress noted at this time.    

## 2020-01-11 NOTE — ED Triage Notes (Signed)
Pt in via EMS from home with c/o NV. Nausea started Monday and vomiting started Wednesday.   152/88, 97% RA, HR 82

## 2020-01-11 NOTE — Discharge Instructions (Signed)

## 2021-10-12 ENCOUNTER — Emergency Department
Admission: EM | Admit: 2021-10-12 | Discharge: 2021-10-12 | Disposition: A | Discharge status: Home / Self Care | Payer: Self-pay | Attending: Emergency Medicine | Admitting: Emergency Medicine

## 2021-10-12 ENCOUNTER — Other Ambulatory Visit: Payer: Self-pay

## 2021-10-12 ENCOUNTER — Encounter: Payer: Self-pay | Admitting: Emergency Medicine

## 2021-10-12 DIAGNOSIS — R591 Generalized enlarged lymph nodes: Secondary | ICD-10-CM

## 2021-10-12 DIAGNOSIS — K047 Periapical abscess without sinus: Secondary | ICD-10-CM | POA: Insufficient documentation

## 2021-10-12 DIAGNOSIS — R590 Localized enlarged lymph nodes: Secondary | ICD-10-CM | POA: Insufficient documentation

## 2021-10-12 MED ORDER — AMOXICILLIN 500 MG PO CAPS: 500.0000 mg | ORAL_CAPSULE | Freq: Two times a day (BID) | ORAL | 0 refills | Status: AC

## 2021-10-12 NOTE — ED Provider Notes (Signed)
? ?  [facility] ?Provider Note ? ? ? Event Date/Time  ? First MD Initiated Contact with Patient 10/12/21 0902   ?  (approximate) ? ? ?History  ? ?neck swelling ? ? ?HPI ? ?Glenda Sloan is a 51 y.o. female with history of diverticulitis presents with complaints of nodules noted in her right neck.  Patient reports she noticed them over the last 2 to 3 days.  She denies difficulty swallowing.  She reports they do not hurt.  She does felt swollen areas in her right neck.  She does report a history of poor dentition, she does smoke cigarettes.  No fevers chills.  No sore throat. ?  ? ? ?Physical Exam  ? ?Triage Vital Signs: ?ED Triage Vitals  ?Enc Vitals Group  ?   BP 10/12/21 0850 (!) 158/89  ?   Pulse Rate 10/12/21 0850 66  ?   Resp 10/12/21 0850 18  ?   Temp 10/12/21 0850 98 ?F (36.7 ?C)  ?   Temp Source 10/12/21 0850 Oral  ?   SpO2 10/12/21 0850 98 %  ?   Weight 10/12/21 0851 78 kg (172 lb)  ?   Height 10/12/21 0851 1.626 m (5\' 4" )  ?   Head Circumference --   ?   Peak Flow --   ?   Pain Score 10/12/21 0851 7  ?   Pain Loc --   ?   Pain Edu? --   ?   Excl. in [place]? --   ? ? ?Most recent vital signs: ?Vitals:  ? 10/12/21 0850  ?BP: (!) 158/89  ?Pulse: 66  ?Resp: 18  ?Temp: 98 ?F (36.7 ?C)  ?SpO2: 98%  ? ? ? ?General: Awake, no distress.  ?CV:  Good peripheral perfusion.  ?Resp:  Normal effort.  ?Abd:  No distention.  ?Other:  Positive lymphadenopathy right anterior cervical chain.  Pharynx normal.  Poor dentition ? ? ?ED Results / Procedures / Treatments  ? ?Labs ?(all labs ordered are listed, but only abnormal results are displayed) ?Labs Reviewed - No data to display ? ? ?EKG ? ? ? ? ?RADIOLOGY ? ? ? ? ?PROCEDURES: ? ?Critical Care performed:  ? ?Procedures ? ? ?MEDICATIONS ORDERED IN ED: ?Medications - No data to display ? ? ?IMPRESSION / MDM / ASSESSMENT AND PLAN / ED COURSE  ?I reviewed the triage vital signs and the nursing notes. ? ?Patient has poor dentition with mild tenderness along the  bottom right broken molar, suspects early infection leading to lymphadenopathy.  Will treat with antibiotics.  If no improvement in lymphadenopathy patient will need further work-up as I detailed to her at length. ? ? ? ? ? ?  ? ? ?FINAL CLINICAL IMPRESSION(S) / ED DIAGNOSES  ? ?Final diagnoses:  ?Dental infection  ?Lymphadenopathy  ? ? ? ?Rx / DC Orders  ? ?ED Discharge Orders   ? ?      Ordered  ?  amoxicillin (AMOXIL) 500 MG capsule  2 times daily       ? 10/12/21 0918  ? ?  ?  ? ?  ? ? ? ?Note:  This document was prepared using Dragon voice recognition software and may include unintentional dictation errors. ?  ?10/14/21, MD ?10/12/21 1210 ? ?

## 2021-10-12 NOTE — ED Triage Notes (Signed)
Pt to ED via POV with c/o sneezing the other day and now the right side of her neck is swollen and hurts to raise her right arm. She can feel two knots on the right side of her neck.  ?

## 2021-10-12 NOTE — Discharge Instructions (Signed)
Please take the antibiotics as prescribed, if the lymph nodes in your neck do not go back to normal you need to have follow-up with your PCP or return to the emergency department. ?

## 2021-12-24 ENCOUNTER — Encounter: Payer: Self-pay | Admitting: Nurse Practitioner

## 2021-12-24 ENCOUNTER — Ambulatory Visit: Payer: 59 | Admitting: Nurse Practitioner

## 2021-12-24 DIAGNOSIS — Z113 Encounter for screening for infections with a predominantly sexual mode of transmission: Secondary | ICD-10-CM

## 2021-12-24 LAB — HM HEPATITIS C SCREENING LAB: HM Hepatitis Screen: NEGATIVE

## 2021-12-24 LAB — WET PREP FOR TRICH, YEAST, CLUE
Trichomonas Exam: NEGATIVE
Yeast Exam: NEGATIVE

## 2021-12-24 LAB — HEPATITIS B SURFACE ANTIGEN

## 2021-12-24 LAB — HM HIV SCREENING LAB: HM HIV Screening: NEGATIVE

## 2021-12-24 NOTE — Progress Notes (Signed)
WET PREP negative. No provider orders to complete at this time. Verbalized understanding of further testing results pending.  Keitha Butte RN

## 2021-12-24 NOTE — Progress Notes (Signed)
[facility] Department  STI clinic/screening visit [address] [place] [address] [phone]  Subjective:  Glenda Sloan is a 51 y.o. female being seen today for an STI screening visit. The patient reports they do have symptoms.  Patient reports that they do not desire a pregnancy in the next year.   They reported they are not interested in discussing contraception today.  Patient is postmenopausal.    Patient's last menstrual period was 03/16/2017.   Patient has the following medical conditions:  There are no problems to display for this patient.   Chief Complaint  Patient presents with   SEXUALLY TRANSMITTED DISEASE    Screening    HPI  Patient reports to clinic today for STD screening. Patient reports a sore throat that occurred 3 months ago.  Patient states that her partner was exposed to Syphilis and currently being treated.    Last HIV test per patient/review of record was 2014 Patient reports last pap was 2008.   Screening for MPX risk: Does the patient have an unexplained rash? No Is the patient MSM? No Does the patient endorse multiple sex partners or anonymous sex partners? No Did the patient have close or sexual contact with a person diagnosed with MPX? No Has the patient traveled outside the [place] where MPX is endemic? No Is there a high clinical suspicion for MPX-- evidenced by one of the following No  -Unlikely to be chickenpox  -Lymphadenopathy  -Rash that present in same phase of evolution on any given body part See flowsheet for further details and programmatic requirements.   Immunization history:  Immunization History  Administered Date(s) Administered   Hep A / Hep B 12/14/2006, 01/11/2007, 07/17/2007   Tdap 12/14/2006     The following portions of the patient's history were reviewed and updated as appropriate: allergies, current medications, past medical history, past social history, past surgical history and problem  list.  Objective:  There were no vitals filed for this visit.  Physical Exam Constitutional:      Appearance: Normal appearance.  HENT:     Head: Normocephalic.     Right Ear: External ear normal.     Left Ear: External ear normal.     Nose: Nose normal.     Mouth/Throat:     Lips: Pink.     Mouth: Mucous membranes are moist.     Comments: Poor detention  Pulmonary:     Effort: Pulmonary effort is normal.  Abdominal:     General: Abdomen is flat.     Palpations: Abdomen is soft.  Genitourinary:    Comments: External genitalia/pubic area without nits, lice, edema, erythema, lesions and inguinal adenopathy. Vagina with normal mucosa and discharge. Cervix without visible lesions. Uterus firm, mobile, nt, no masses, no CMT, no adnexal tenderness or fullness. pH 4.5. Musculoskeletal:     Cervical back: Full passive range of motion without pain, normal range of motion and neck supple.  Skin:    General: Skin is warm and dry.  Neurological:     Mental Status: She is alert and oriented to person, place, and time.  Psychiatric:        Attention and Perception: Attention normal.        Mood and Affect: Mood normal.        Speech: Speech normal.        Behavior: Behavior normal. Behavior is cooperative.     Assessment and Plan:  Glenda Sloan is a 51  y.o. female presenting to the [facility] Department for STI screening  1. Screening examination for venereal disease -51 year old female in clinic today for STD screening. -Patient accepted all screenings including oral GC, wet prep, vaginal CT/GC and bloodwork for HIV/RPR.  Patient meets criteria for HepB screening? Yes. Ordered? Yes Patient meets criteria for HepC screening? Yes. Ordered? Yes  Treat wet prep per standing order Discussed time line for State Lab results and that patient will be called with positive results and encouraged patient to call if she had not heard in 2 weeks.  Counseled to return or seek care  for continued or worsening symptoms Recommended condom use with all sex  Patient is currently not using  contraception   to prevent pregnancy.  Patient is postmenopausal.    - Chlamydia/Gonorrhea [facility] - HIV/HCV [facility] - Syphilis Serology, [facility] - HBV Antigen/Antibody State Lab - WET PREP FOR TRICH, YEAST, CLUE - Gonococcus culture     Return if symptoms worsen or fail to improve.    Glenna Fellows, FNP

## 2021-12-28 LAB — GONOCOCCUS CULTURE

## 2022-01-06 ENCOUNTER — Telehealth: Payer: Self-pay

## 2022-01-06 ENCOUNTER — Ambulatory Visit: Payer: 59

## 2022-01-06 DIAGNOSIS — A539 Syphilis, unspecified: Secondary | ICD-10-CM

## 2022-01-06 MED ORDER — PENICILLIN G BENZATHINE 1200000 UNIT/2ML IM SUSY
2.4000 10*6.[IU] | PREFILLED_SYRINGE | Freq: Once | INTRAMUSCULAR | Status: AC
  Administered 2022-01-06: 2.4 10*6.[IU] via INTRAMUSCULAR

## 2022-01-06 NOTE — Telephone Encounter (Signed)
Phone call to pt at [phone].  Rang several times then received a busy signal. Tried twice.  MyChart pending.  (Syphilis dx or tx before?)

## 2022-01-06 NOTE — Telephone Encounter (Addendum)
Received phone call back from pt. Pt confirmed password.  Counseled pt regarding + syphilis result and provider has ordered tx x3 and explained how it works, every 7 days.   Pt states NKA. Due to RN staffing in RN clinic today, added pt to today's schedule for 1st tx.    2nd and 3rd tx appts have already been scheduled.  #1 set:  Completed 1st set of injections on 01/06/22 #2 set:  (6/21 appt) #3 set:  (6/28 appt)

## 2022-01-06 NOTE — Telephone Encounter (Signed)
Phone call to pt at [phone], rang then received busy signal. Tried twice.  Phone call to alternate contact number listed in pt chart, [phone]. Left message that this number was listed as alternative number to get in contact with Glenda Sloan. Please have [name] call her dr's office.

## 2022-01-06 NOTE — Telephone Encounter (Signed)
Syphilis titer positive from 12/24/21 specimen, pt needs tx.  DIS:  No records prior to [facility] 12/24/21 syphilis test Information provided to Wendi Snipes, FNP about hx.  Meteea to write orders on TR and send for scanning. (Tx x3)

## 2022-01-06 NOTE — Progress Notes (Signed)
In Nurse Clinic for syphilis tx / Bicillin #1. Pt explains she has an open sore on tongue  which she has had for one week and enlarged lymph nodes. Pt advised to have it evaluated. No PCP. Local PCP list given and pt says she will call [facility] for appt. States she has insurance.   Per order by Elveria Rising, FNP on 01/05/2022, pt is to be treated with Bicillin 2.4 mu IM weekly for 3 weeks. RN administered Bicillin.  Tolerated well. Advised to stay for 20 min observation, pt stayed for about 15 min. Without problem.  Pt has appts scheduled for next 2 bicillin injections. Jerel Shepherd, RN

## 2022-01-13 NOTE — Telephone Encounter (Signed)
This RN received notification from clinic RN that pt did not show for her tx appt this AM.  Phone call to pt at [phone].  Rang several times and started giving a busy signal. Not able to leave voicemail message. Tried twice. MyChart pending.  Phone call to alternative pt contact listed in chart, [phone]. Left message trying to get in touch with Glenda Sloan, this number listed as alternate number to get in touch with her, please have Glenda Sloan call her doctors office as soon as possible.

## 2022-01-14 ENCOUNTER — Ambulatory Visit: Payer: Self-pay

## 2022-01-14 DIAGNOSIS — A539 Syphilis, unspecified: Secondary | ICD-10-CM

## 2022-01-14 MED ORDER — PENICILLIN G BENZATHINE 1200000 UNIT/2ML IM SUSY
2.4000 10*6.[IU] | PREFILLED_SYRINGE | Freq: Once | INTRAMUSCULAR | Status: AC
  Administered 2022-01-14: 2.4 10*6.[IU] via INTRAMUSCULAR

## 2022-01-14 NOTE — Telephone Encounter (Signed)
Phone call to pt. Pt states she is already at [facility] and someone is working her in the schedule to get tx today, 01/14/22.  Next appt for 3rd set of injections has been rescheduled for 01/21/22.

## 2022-01-14 NOTE — Telephone Encounter (Signed)
#  1 set:  Completed 1st set of injections on 01/06/22 #2 set:  Completed 2nd set 01/14/22 #3 set:  (has 6/29 appt)

## 2022-01-21 ENCOUNTER — Ambulatory Visit: Payer: Self-pay

## 2022-01-21 DIAGNOSIS — A539 Syphilis, unspecified: Secondary | ICD-10-CM

## 2022-01-21 MED ORDER — PENICILLIN G BENZATHINE 1200000 UNIT/2ML IM SUSY
2.4000 10*6.[IU] | PREFILLED_SYRINGE | Freq: Once | INTRAMUSCULAR | Status: AC
  Administered 2022-01-21: 2.4 10*6.[IU] via INTRAMUSCULAR

## 2022-01-21 NOTE — Progress Notes (Signed)
In Nurse Clinic for syphilis tx - Bicillin # 3. Reports no problems with previous bicillin injections.   Bicillin 2.4 mu given IM  today per order by Elveria Rising, FNP dated 01/05/2022. Order located on scanned state lab.   Tolerated injections well today. Stayed for 20 min observation without problem. Due for RPR in 6 months, reminder given. Questions answered and reports understanding. Jerel Shepherd, RN

## 2022-08-04 ENCOUNTER — Emergency Department
Admission: EM | Admit: 2022-08-04 | Discharge: 2022-08-04 | Disposition: A | Discharge status: Home / Self Care | Payer: 59 | Attending: Emergency Medicine | Admitting: Emergency Medicine

## 2022-08-04 ENCOUNTER — Encounter: Payer: Self-pay | Admitting: Emergency Medicine

## 2022-08-04 ENCOUNTER — Other Ambulatory Visit: Payer: Self-pay

## 2022-08-04 DIAGNOSIS — N3001 Acute cystitis with hematuria: Secondary | ICD-10-CM | POA: Insufficient documentation

## 2022-08-04 DIAGNOSIS — R319 Hematuria, unspecified: Secondary | ICD-10-CM | POA: Diagnosis not present

## 2022-08-04 LAB — URINALYSIS, ROUTINE W REFLEX MICROSCOPIC
Bilirubin Urine: NEGATIVE
Glucose, UA: NEGATIVE mg/dL
Ketones, ur: NEGATIVE mg/dL
Nitrite: NEGATIVE
Protein, ur: 30 mg/dL — AB
Specific Gravity, Urine: 1.001 — ABNORMAL LOW (ref 1.005–1.030)
Squamous Epithelial / HPF: NONE SEEN /HPF (ref 0–5)
pH: 6 (ref 5.0–8.0)

## 2022-08-04 LAB — BASIC METABOLIC PANEL
Anion gap: 10 (ref 5–15)
BUN: 11 mg/dL (ref 6–20)
CO2: 28 mmol/L (ref 22–32)
Calcium: 9.3 mg/dL (ref 8.9–10.3)
Chloride: 98 mmol/L (ref 98–111)
Creatinine, Ser: 0.83 mg/dL (ref 0.44–1.00)
GFR, Estimated: >60 mL/min (ref >60)
Glucose, Bld: 104 mg/dL — ABNORMAL HIGH (ref 70–99)
Potassium: 3.8 mmol/L (ref 3.5–5.1)
Sodium: 136 mmol/L (ref 135–145)

## 2022-08-04 LAB — CBC
HCT: 49.8 % — ABNORMAL HIGH (ref 36.0–46.0)
Hemoglobin: 16.2 g/dL — ABNORMAL HIGH (ref 12.0–15.0)
MCH: 32 pg (ref 26.0–34.0)
MCHC: 32.5 g/dL (ref 30.0–36.0)
MCV: 98.2 fL (ref 80.0–100.0)
Platelets: 324 10*3/uL (ref 150–400)
RBC: 5.07 MIL/uL (ref 3.87–5.11)
RDW: 11.9 % (ref 11.5–15.5)
WBC: 10 10*3/uL (ref 4.0–10.5)
nRBC: 0 % (ref 0.0–0.2)

## 2022-08-04 LAB — PREGNANCY, URINE: Preg Test, Ur: NEGATIVE

## 2022-08-04 MED ORDER — CEPHALEXIN 500 MG PO CAPS: 500.0000 mg | ORAL_CAPSULE | Freq: Three times a day (TID) | ORAL | 0 refills | Status: AC

## 2022-08-04 NOTE — ED Provider Triage Note (Signed)
Emergency Medicine Provider Triage Evaluation Note  Glenda Sloan , a 52 y.o. female  was evaluated in triage.  Pt complains of blood in urine. States that she has had UTI in past but never passing blood and clots.   Review of Systems  Positive: + nausea Negative: No vomiting or known fever  Physical Exam  BP (!) 151/99 (BP Location: Left Arm)   Pulse 66   Temp 98.5 F (36.9 C)   Resp 18   Ht 5\' 5"  (1.651 m)   Wt 71.7 kg   LMP 03/16/2017   SpO2 100%   BMI 26.29 kg/m  Gen:   Awake, no distress   Resp:  Normal effort  MSK:   Moves extremities without difficulty.  Ambulates without assistance.  Other:    Medical Decision Making  Medically screening exam initiated at 2:17 PM.  Appropriate orders placed.  Glenda Sloan was informed that the remainder of the evaluation will be completed by another provider, this initial triage assessment does not replace that evaluation, and the importance of remaining in the ED until their evaluation is complete.     Johnn Hai, PA-C 08/04/22 1426

## 2022-08-04 NOTE — ED Triage Notes (Signed)
Patient to ED via POV for blood in urine. Hx of UTI. C/o back pain- centralized and nausea.

## 2022-08-04 NOTE — ED Provider Notes (Signed)
[facility] Provider Note    Event Date/Time   First MD Initiated Contact with Patient 08/04/22 1619     (approximate)   History   Hematuria   HPI  Glenda Sloan is a 52 y.o. female with history of diverticulitis and as listed in the EMR presents emergency department for treatment and evaluation of hematuria.  She has had history of urinary tract infection in the past.  She has had some frequency over the past couple of days.  Back pain is in the thoracic area and feels "sore."  She believes this is related to not moving around much due to urinary symptoms.     Physical Exam   Triage Vital Signs: ED Triage Vitals  Enc Vitals Group     BP 08/04/22 1415 (!) 151/99     Pulse Rate 08/04/22 1415 66     Resp 08/04/22 1415 18     Temp 08/04/22 1415 98.5 F (36.9 C)     Temp src --      SpO2 08/04/22 1415 100 %     Weight 08/04/22 1416 158 lb (71.7 kg)     Height 08/04/22 1416 5\' 5"  (1.651 m)     Head Circumference --      Peak Flow --      Pain Score 08/04/22 1416 10     Pain Loc --      Pain Edu? --      Excl. in [place]? --     Most recent vital signs: Vitals:   08/04/22 1415 08/04/22 1650  BP: (!) 151/99 (!) 148/96  Pulse: 66 68  Resp: 18 18  Temp: 98.5 F (36.9 C) 98.4 F (36.9 C)  SpO2: 100% 100%     General: Awake, no distress.  CV:  Good peripheral perfusion.  Resp:  Normal effort.  Abd:  No distention.  Other:  No CVA tenderness.  No flank pain.  Positive for suprapubic tenderness   ED Results / Procedures / Treatments   Labs (all labs ordered are listed, but only abnormal results are displayed) Labs Reviewed  URINALYSIS, ROUTINE W REFLEX MICROSCOPIC - Abnormal; Notable for the following components:      Result Value   Color, Urine AMBER (*)    APPearance HAZY (*)    Specific Gravity, Urine 1.001 (*)    Hgb urine dipstick LARGE (*)    Protein, ur 30 (*)    Leukocytes,Ua LARGE (*)    Bacteria, UA RARE (*)    All other  components within normal limits  BASIC METABOLIC PANEL - Abnormal; Notable for the following components:   Glucose, Bld 104 (*)    All other components within normal limits  CBC - Abnormal; Notable for the following components:   Hemoglobin 16.2 (*)    HCT 49.8 (*)    All other components within normal limits  PREGNANCY, URINE     EKG     RADIOLOGY  Not indicated   PROCEDURES:  Critical Care performed: No  Procedures   MEDICATIONS ORDERED IN ED: Medications - No data to display   IMPRESSION / MDM / [place] / ED COURSE  I reviewed the triage vital signs and the nursing notes.                              Differential diagnosis includes, but is not limited to, acute cystitis, interstitial cystitis, kidney stone,  pyelonephritis, musculoskeletal pain  Patient's presentation is most consistent with acute illness requiring diagnostic workup  52 year old female presenting to the emergency department for treatment and evaluation of hematuria.  See HPI for further details.  Urinalysis is consistent with acute cystitis with hematuria.  Exam is not consistent with kidney stone or pyelonephritis.  Results reviewed with the patient and plan will be to treat her with antibiotics.  She is to call and schedule follow-up appointment with her primary care provider in a couple weeks to make sure that the infection is completely cleared.  If symptoms change or worsen if she is unable to schedule an appointment, she is to return to the emergency department.     FINAL CLINICAL IMPRESSION(S) / ED DIAGNOSES   Final diagnoses:  Acute cystitis with hematuria     Rx / DC Orders   ED Discharge Orders          Ordered    cephALEXin (KEFLEX) 500 MG capsule  3 times daily        08/04/22 1646             Note:  This document was prepared using Dragon voice recognition software and may include unintentional dictation errors.   Victorino Dike, FNP 08/04/22  2340    Lucillie Garfinkel, MD 08/05/22 [phone]

## 2022-08-04 NOTE — ED Notes (Addendum)
Called lab to add on urine preg. Lab staff states they will run it now. See triage note. Pt alert, sitting on stretcher calmly, skin dry, in NAD.

## 2022-08-06 ENCOUNTER — Emergency Department: Payer: 59

## 2022-08-06 ENCOUNTER — Emergency Department
Admission: EM | Admit: 2022-08-06 | Discharge: 2022-08-07 | Disposition: A | Discharge status: Home / Self Care | Payer: 59 | Attending: Emergency Medicine | Admitting: Emergency Medicine

## 2022-08-06 ENCOUNTER — Other Ambulatory Visit: Payer: Self-pay

## 2022-08-06 DIAGNOSIS — N390 Urinary tract infection, site not specified: Secondary | ICD-10-CM

## 2022-08-06 DIAGNOSIS — R112 Nausea with vomiting, unspecified: Secondary | ICD-10-CM | POA: Insufficient documentation

## 2022-08-06 DIAGNOSIS — R001 Bradycardia, unspecified: Secondary | ICD-10-CM | POA: Diagnosis not present

## 2022-08-06 DIAGNOSIS — M549 Dorsalgia, unspecified: Secondary | ICD-10-CM | POA: Diagnosis not present

## 2022-08-06 DIAGNOSIS — D72829 Elevated white blood cell count, unspecified: Secondary | ICD-10-CM | POA: Insufficient documentation

## 2022-08-06 DIAGNOSIS — Z1152 Encounter for screening for COVID-19: Secondary | ICD-10-CM | POA: Insufficient documentation

## 2022-08-06 DIAGNOSIS — R1010 Upper abdominal pain, unspecified: Secondary | ICD-10-CM | POA: Diagnosis not present

## 2022-08-06 DIAGNOSIS — R1011 Right upper quadrant pain: Secondary | ICD-10-CM | POA: Insufficient documentation

## 2022-08-06 DIAGNOSIS — N3289 Other specified disorders of bladder: Secondary | ICD-10-CM | POA: Diagnosis not present

## 2022-08-06 LAB — URINALYSIS, ROUTINE W REFLEX MICROSCOPIC
Bilirubin Urine: NEGATIVE
Glucose, UA: NEGATIVE mg/dL
Hgb urine dipstick: NEGATIVE
Ketones, ur: NEGATIVE mg/dL
Nitrite: NEGATIVE
Protein, ur: 30 mg/dL — AB
Specific Gravity, Urine: 1.014 (ref 1.005–1.030)
pH: 6 (ref 5.0–8.0)

## 2022-08-06 LAB — COMPREHENSIVE METABOLIC PANEL
ALT: 11 U/L (ref 0–44)
AST: 17 U/L (ref 15–41)
Albumin: 4 g/dL (ref 3.5–5.0)
Alkaline Phosphatase: 85 U/L (ref 38–126)
Anion gap: 9 (ref 5–15)
BUN: 10 mg/dL (ref 6–20)
CO2: 28 mmol/L (ref 22–32)
Calcium: 9.4 mg/dL (ref 8.9–10.3)
Chloride: 100 mmol/L (ref 98–111)
Creatinine, Ser: 0.91 mg/dL (ref 0.44–1.00)
GFR, Estimated: >60 mL/min (ref >60)
Glucose, Bld: 106 mg/dL — ABNORMAL HIGH (ref 70–99)
Potassium: 3.5 mmol/L (ref 3.5–5.1)
Sodium: 137 mmol/L (ref 135–145)
Total Bilirubin: 0.9 mg/dL (ref 0.3–1.2)
Total Protein: 8.4 g/dL — ABNORMAL HIGH (ref 6.5–8.1)

## 2022-08-06 LAB — CBC
HCT: 46.2 % — ABNORMAL HIGH (ref 36.0–46.0)
Hemoglobin: 15.2 g/dL — ABNORMAL HIGH (ref 12.0–15.0)
MCH: 32.1 pg (ref 26.0–34.0)
MCHC: 32.9 g/dL (ref 30.0–36.0)
MCV: 97.5 fL (ref 80.0–100.0)
Platelets: 345 10*3/uL (ref 150–400)
RBC: 4.74 MIL/uL (ref 3.87–5.11)
RDW: 11.8 % (ref 11.5–15.5)
WBC: 10.4 10*3/uL (ref 4.0–10.5)
nRBC: 0 % (ref 0.0–0.2)

## 2022-08-06 LAB — TROPONIN I (HIGH SENSITIVITY): Troponin I (High Sensitivity): 7 ng/L (ref <18)

## 2022-08-06 LAB — LIPASE, BLOOD: Lipase: 36 U/L (ref 11–51)

## 2022-08-06 MED ORDER — DROPERIDOL 2.5 MG/ML IJ SOLN
1.2500 mg | Freq: Once | INTRAMUSCULAR | Status: AC
  Administered 2022-08-07: 1.25 mg via INTRAVENOUS
  Filled 2022-08-06: qty 2

## 2022-08-06 MED ORDER — PANTOPRAZOLE SODIUM 40 MG IV SOLR
40.0000 mg | Freq: Once | INTRAVENOUS | Status: AC
  Administered 2022-08-07: 40 mg via INTRAVENOUS
  Filled 2022-08-06: qty 10

## 2022-08-06 MED ORDER — FOSFOMYCIN TROMETHAMINE 3 G PO PACK
3.0000 g | PACK | Freq: Once | ORAL | Status: AC
  Administered 2022-08-07: 3 g via ORAL
  Filled 2022-08-06: qty 3

## 2022-08-06 MED ORDER — SODIUM CHLORIDE 0.9 % IV BOLUS
1000.0000 mL | Freq: Once | INTRAVENOUS | Status: AC
  Administered 2022-08-07: 1000 mL via INTRAVENOUS

## 2022-08-06 MED ORDER — ONDANSETRON 4 MG PO TBDP
4.0000 mg | ORAL_TABLET | Freq: Once | ORAL | Status: AC | PRN
  Administered 2022-08-06: 4 mg via ORAL
  Filled 2022-08-06: qty 1

## 2022-08-06 MED ORDER — ALUM & MAG HYDROXIDE-SIMETH 200-200-20 MG/5ML PO SUSP
30.0000 mL | Freq: Once | ORAL | Status: AC
  Administered 2022-08-07: 30 mL via ORAL
  Filled 2022-08-06: qty 30

## 2022-08-06 MED ORDER — ACETAMINOPHEN 500 MG PO TABS: 1000.0000 mg | ORAL_TABLET | Freq: Once | ORAL | Status: DC

## 2022-08-06 NOTE — ED Provider Notes (Addendum)
[facility] Provider Note    Event Date/Time   First MD Initiated Contact with Patient 08/06/22 2151     (approximate)   History   Abdominal Pain   HPI  Glenda Sloan is a 52 y.o. female who reports being seen in the emergency room on 1/10.  I reviewed the records were patient has a history of UTI as well as some back pain.  She was noted to have a little bit of blood in her urine and large leukocytes so they put her on Keflex.  She reports starting the Keflex and then developing a lot of nausea vomiting upper abdominal pain as well as continued back pain.  She denies any lower abdominal pain to suggest diverticulitis does report a history of this previously.  Patient reports associated nausea, vomiting.  She denies any diarrhea.  Reports the pain is more in the upper abdomen.  Denies having this happen previously.  Does report still having her gallbladder.  Physical Exam   Triage Vital Signs: ED Triage Vitals  Enc Vitals Group     BP 08/06/22 2121 (!) 129/91     Pulse Rate 08/06/22 2121 81     Resp 08/06/22 2121 (!) 22     Temp 08/06/22 2121 98.2 F (36.8 C)     Temp Source 08/06/22 2121 Oral     SpO2 08/06/22 2121 97 %     Weight 08/06/22 2120 158 lb (71.7 kg)     Height --      Head Circumference --      Peak Flow --      Pain Score 08/06/22 2119 10     Pain Loc --      Pain Edu? --      Excl. in [place]? --     Most recent vital signs: Vitals:   08/06/22 2121 08/06/22 2200  BP: (!) 129/91 (!) 143/92  Pulse: 81 63  Resp: (!) 22 19  Temp: 98.2 F (36.8 C) 98.1 F (36.7 C)  SpO2: 97% 95%     General: Awake, no distress.  CV:  Good peripheral perfusion.  Resp:  Normal effort.  Abd:  No distention.  Tender in the upper abdomen.  No rebound or guarding. Other:     ED Results / Procedures / Treatments   Labs (all labs ordered are listed, but only abnormal results are displayed) Labs Reviewed  COMPREHENSIVE METABOLIC PANEL - Abnormal;  Notable for the following components:      Result Value   Glucose, Bld 106 (*)    Total Protein 8.4 (*)    All other components within normal limits  CBC - Abnormal; Notable for the following components:   Hemoglobin 15.2 (*)    HCT 46.2 (*)    All other components within normal limits  URINALYSIS, ROUTINE W REFLEX MICROSCOPIC - Abnormal; Notable for the following components:   Color, Urine YELLOW (*)    APPearance CLOUDY (*)    Protein, ur 30 (*)    Leukocytes,Ua MODERATE (*)    Bacteria, UA RARE (*)    All other components within normal limits  LIPASE, BLOOD     EKG  My interpretation of EKG:  Pending   RADIOLOGY I reviewed the CT personally interpreted no evidence of kidney stone  PROCEDURES:  Critical Care performed: No  Procedures   MEDICATIONS ORDERED IN ED: Medications  ondansetron (ZOFRAN-ODT) disintegrating tablet 4 mg (4 mg Oral Given 08/06/22 2124)  IMPRESSION / MDM / ASSESSMENT AND PLAN / ED COURSE  I reviewed the triage vital signs and the nursing notes.   Patient's presentation is most consistent with acute presentation with potential threat to life or bodily function.   Patient comes in with upper abdominal discomfort nausea, vomiting in the setting of recent antibiotics.  Labs ordered to evaluate for Electra abnormalities, AKI ultrasound ordered to evaluate for gallstones and CT renal to make sure is no kidney stone given some mild pain in her back.  No lower abdominal pain to suggest diverticulitis.  UA with significant amount of squamous cells and it is not a great sample.  Her lipase is normal CMP reassuring.  CBC reassuring  CT imaging ultrasound are reassuring.  Patient still pending EKG, repeat troponin.  Patient still having symptoms but has not gotten any of the medications.  Patient was handed off to oncoming team pending medications reassessment.  Discussed with patient she never had a urine culture done but we discussed the pros and  cons of doing a fosfomycin dose that she can discontinue any oral medications at home.  She understands the fosfomycin may not fully treated but she would like to do this and give this a try.  She will return to the ER she develops return of urinary symptoms.      FINAL CLINICAL IMPRESSION(S) / ED DIAGNOSES   Final diagnoses:  Nausea and vomiting, unspecified vomiting type     Rx / DC Orders   ED Discharge Orders     None        Note:  This document was prepared using Dragon voice recognition software and may include unintentional dictation errors.   Vanessa [place], MD 08/06/22 2244    Vanessa [place], MD 08/07/22 0001

## 2022-08-06 NOTE — ED Triage Notes (Signed)
Pt in with n/v x 2 days, states it began when she started taking her Keflex for newly dx UTI on 1/10. States unable to eat/drink or keep meds down x 2 days. Reporting improved UTI symptoms

## 2022-08-07 LAB — RESP PANEL BY RT-PCR (RSV, FLU A&B, COVID)  RVPGX2
Influenza A by PCR: NEGATIVE
Influenza B by PCR: NEGATIVE
Resp Syncytial Virus by PCR: NEGATIVE
SARS Coronavirus 2 by RT PCR: NEGATIVE

## 2022-08-07 LAB — MAGNESIUM: Magnesium: 2.2 mg/dL (ref 1.7–2.4)

## 2022-08-07 LAB — TROPONIN I (HIGH SENSITIVITY): Troponin I (High Sensitivity): 6 ng/L (ref <18)

## 2022-08-07 MED ORDER — POTASSIUM CHLORIDE CRYS ER 20 MEQ PO TBCR
20.0000 meq | EXTENDED_RELEASE_TABLET | Freq: Once | ORAL | Status: AC
  Administered 2022-08-07: 20 meq via ORAL
  Filled 2022-08-07: qty 1

## 2022-08-07 MED ORDER — ONDANSETRON 4 MG PO TBDP: 4.0000 mg | ORAL_TABLET | Freq: Three times a day (TID) | ORAL | 0 refills | Status: AC | PRN

## 2022-08-07 NOTE — ED Notes (Signed)
Pt is calling ride for d/c home

## 2022-08-07 NOTE — ED Provider Notes (Signed)
2:45 AM Assumed care of patient at shift change.  Repeat troponin negative.  COVID, flu and RSV negative.  Patient given droperidol.  EKG showed prolonged QT interval.  Magnesium level normal.  Potassium of 3.5.  Will give oral replacement to optimize.  Monitored patient but then repeat EKG still shows prolonged QT interval.  She will need to be monitored until this has improved given she has already been discharged with a prescription for Zofran.   4:42 AM  Pt's repeat EKG shows a normal QT interval.  She has now safe for discharge home.    EKG Interpretation  Date/Time:  Saturday August 07 2022 01:56:35 EST Ventricular Rate:  60 PR Interval:  138 QRS Duration: 78 QT Interval:  532 QTC Calculation: 532 R Axis:   53 Text Interpretation: Normal sinus rhythm with sinus arrhythmia Minimal voltage criteria for LVH, may be normal variant ( Sokolow-Lyon ) Prolonged QT Abnormal ECG When compared with ECG of 11-Jan-2020 21:01, PREVIOUS ECG IS PRESENT Confirmed by Pryor Curia [phone]) on 08/07/2022 2:08:30 AM        EKG Interpretation  Date/Time:  Saturday August 07 2022 04:38:51 EST Ventricular Rate:  48 PR Interval:  138 QRS Duration: 86 QT Interval:  534 QTC Calculation: 477 R Axis:   66 Text Interpretation: Sinus bradycardia Minimal voltage criteria for LVH, may be normal variant ( Sokolow-Lyon ) Borderline ECG When compared with ECG of 07-Aug-2022 02:43, (unconfirmed) No significant change was found QT improved Confirmed by Pryor Curia [phone]) on 08/07/2022 4:42:55 AM          Caelen Higinbotham, Delice Bison, DO 08/07/22 [phone]

## 2022-08-07 NOTE — ED Notes (Signed)
Pt tolerating PO fluids

## 2022-08-07 NOTE — ED Notes (Signed)
Patient verbalizes understanding of discharge instructions. Opportunity for questioning and answers were provided. Armband removed by staff, pt discharged from ED. Ambulated out to lobby  

## 2022-08-08 LAB — URINE CULTURE

## 2024-08-27 ENCOUNTER — Emergency Department

## 2024-08-27 ENCOUNTER — Emergency Department
Admission: EM | Admit: 2024-08-27 | Discharge: 2024-08-27 | Disposition: A | Discharge status: Home / Self Care | Attending: Emergency Medicine | Admitting: Emergency Medicine

## 2024-08-27 ENCOUNTER — Other Ambulatory Visit: Payer: Self-pay

## 2024-08-27 DIAGNOSIS — W000XXA Fall on same level due to ice and snow, initial encounter: Secondary | ICD-10-CM | POA: Insufficient documentation

## 2024-08-27 DIAGNOSIS — S82102A Unspecified fracture of upper end of left tibia, initial encounter for closed fracture: Secondary | ICD-10-CM | POA: Insufficient documentation

## 2024-08-27 MED ORDER — HYDROCODONE-ACETAMINOPHEN 5-325 MG PO TABS: 1.0000 | ORAL_TABLET | Freq: Four times a day (QID) | ORAL | 0 refills | Status: AC | PRN

## 2024-08-27 NOTE — ED Notes (Signed)
 Patient transported to X-ray

## 2024-08-27 NOTE — ED Notes (Signed)
 Patient transported to CT

## 2024-08-27 NOTE — ED Triage Notes (Signed)
 C/O falling on ice on Friday, c/o left knee pain. STates unable to bear weight.
